# Patient Record
Sex: Female | Born: 1935 | Race: Black or African American | Hispanic: No | Marital: Single | Smoking: Never smoker
Health system: Southern US, Community
[De-identification: ages and names within clinical notes are randomized; demographics above are authoritative.]

## PROBLEM LIST (undated history)

## (undated) DIAGNOSIS — K529 Noninfective gastroenteritis and colitis, unspecified: Secondary | ICD-10-CM

## (undated) DIAGNOSIS — I10 Essential (primary) hypertension: Secondary | ICD-10-CM

## (undated) DIAGNOSIS — K219 Gastro-esophageal reflux disease without esophagitis: Secondary | ICD-10-CM

---

## 2000-09-29 ENCOUNTER — Emergency Department (HOSPITAL_COMMUNITY): Admission: EM | Admit: 2000-09-29 | Discharge: 2000-09-29 | Payer: Self-pay | Admitting: Emergency Medicine

## 2001-08-25 ENCOUNTER — Encounter: Payer: Self-pay | Admitting: Family Medicine

## 2001-08-25 ENCOUNTER — Ambulatory Visit (HOSPITAL_COMMUNITY): Admission: RE | Admit: 2001-08-25 | Discharge: 2001-08-25 | Payer: Self-pay | Admitting: Family Medicine

## 2001-08-30 ENCOUNTER — Ambulatory Visit (HOSPITAL_COMMUNITY): Admission: RE | Admit: 2001-08-30 | Discharge: 2001-08-30 | Payer: Self-pay | Admitting: General Surgery

## 2002-01-11 ENCOUNTER — Encounter: Payer: Self-pay | Admitting: Family Medicine

## 2002-01-11 ENCOUNTER — Encounter: Payer: Self-pay | Admitting: Internal Medicine

## 2002-01-12 ENCOUNTER — Inpatient Hospital Stay (HOSPITAL_COMMUNITY): Admission: EM | Admit: 2002-01-12 | Discharge: 2002-01-15 | Payer: Self-pay | Admitting: Internal Medicine

## 2002-01-12 ENCOUNTER — Encounter: Payer: Self-pay | Admitting: Family Medicine

## 2002-09-06 ENCOUNTER — Encounter: Payer: Self-pay | Admitting: *Deleted

## 2002-09-07 ENCOUNTER — Inpatient Hospital Stay (HOSPITAL_COMMUNITY): Admission: EM | Admit: 2002-09-07 | Discharge: 2002-09-11 | Payer: Self-pay | Admitting: *Deleted

## 2003-06-10 ENCOUNTER — Ambulatory Visit (HOSPITAL_COMMUNITY): Admission: RE | Admit: 2003-06-10 | Discharge: 2003-06-10 | Payer: Self-pay | Admitting: Family Medicine

## 2003-10-29 ENCOUNTER — Emergency Department (HOSPITAL_COMMUNITY): Admission: EM | Admit: 2003-10-29 | Discharge: 2003-10-29 | Payer: Self-pay | Admitting: Emergency Medicine

## 2004-03-24 ENCOUNTER — Emergency Department (HOSPITAL_COMMUNITY): Admission: EM | Admit: 2004-03-24 | Discharge: 2004-03-24 | Payer: Self-pay | Admitting: Emergency Medicine

## 2004-03-25 ENCOUNTER — Emergency Department (HOSPITAL_COMMUNITY): Admission: EM | Admit: 2004-03-25 | Discharge: 2004-03-26 | Payer: Self-pay | Admitting: Emergency Medicine

## 2004-08-11 ENCOUNTER — Ambulatory Visit (HOSPITAL_COMMUNITY): Admission: RE | Admit: 2004-08-11 | Discharge: 2004-08-11 | Payer: Self-pay | Admitting: Family Medicine

## 2005-02-23 ENCOUNTER — Emergency Department (HOSPITAL_COMMUNITY): Admission: EM | Admit: 2005-02-23 | Discharge: 2005-02-23 | Payer: Self-pay | Admitting: Emergency Medicine

## 2005-04-01 ENCOUNTER — Emergency Department (HOSPITAL_COMMUNITY): Admission: EM | Admit: 2005-04-01 | Discharge: 2005-04-01 | Payer: Self-pay | Admitting: Emergency Medicine

## 2005-07-05 ENCOUNTER — Inpatient Hospital Stay (HOSPITAL_COMMUNITY): Admission: AD | Admit: 2005-07-05 | Discharge: 2005-07-07 | Payer: Self-pay | Admitting: Family Medicine

## 2005-07-06 ENCOUNTER — Ambulatory Visit: Payer: Self-pay | Admitting: *Deleted

## 2005-10-15 ENCOUNTER — Emergency Department (HOSPITAL_COMMUNITY): Admission: EM | Admit: 2005-10-15 | Discharge: 2005-10-15 | Payer: Self-pay | Admitting: Emergency Medicine

## 2007-06-14 ENCOUNTER — Emergency Department (HOSPITAL_COMMUNITY): Admission: EM | Admit: 2007-06-14 | Discharge: 2007-06-14 | Payer: Self-pay | Admitting: Emergency Medicine

## 2007-09-15 ENCOUNTER — Emergency Department (HOSPITAL_COMMUNITY): Admission: EM | Admit: 2007-09-15 | Discharge: 2007-09-15 | Payer: Self-pay | Admitting: Emergency Medicine

## 2007-09-22 ENCOUNTER — Inpatient Hospital Stay (HOSPITAL_COMMUNITY): Admission: EM | Admit: 2007-09-22 | Discharge: 2007-09-26 | Payer: Self-pay | Admitting: Emergency Medicine

## 2007-11-08 ENCOUNTER — Ambulatory Visit: Payer: Self-pay | Admitting: Cardiology

## 2007-11-08 ENCOUNTER — Inpatient Hospital Stay (HOSPITAL_COMMUNITY): Admission: EM | Admit: 2007-11-08 | Discharge: 2007-11-13 | Payer: Self-pay | Admitting: Emergency Medicine

## 2007-11-10 ENCOUNTER — Encounter: Payer: Self-pay | Admitting: Cardiology

## 2009-06-20 IMAGING — CR DG CHEST 1V PORT
1 series · 1 of 1 positions shown · non-contrast
Comparison: 09/22/2007 and earlier.

CLINICAL DATA: 89-year-old female with hypertension.

PORTABLE CHEST - 1 VIEW

[view not recorded]
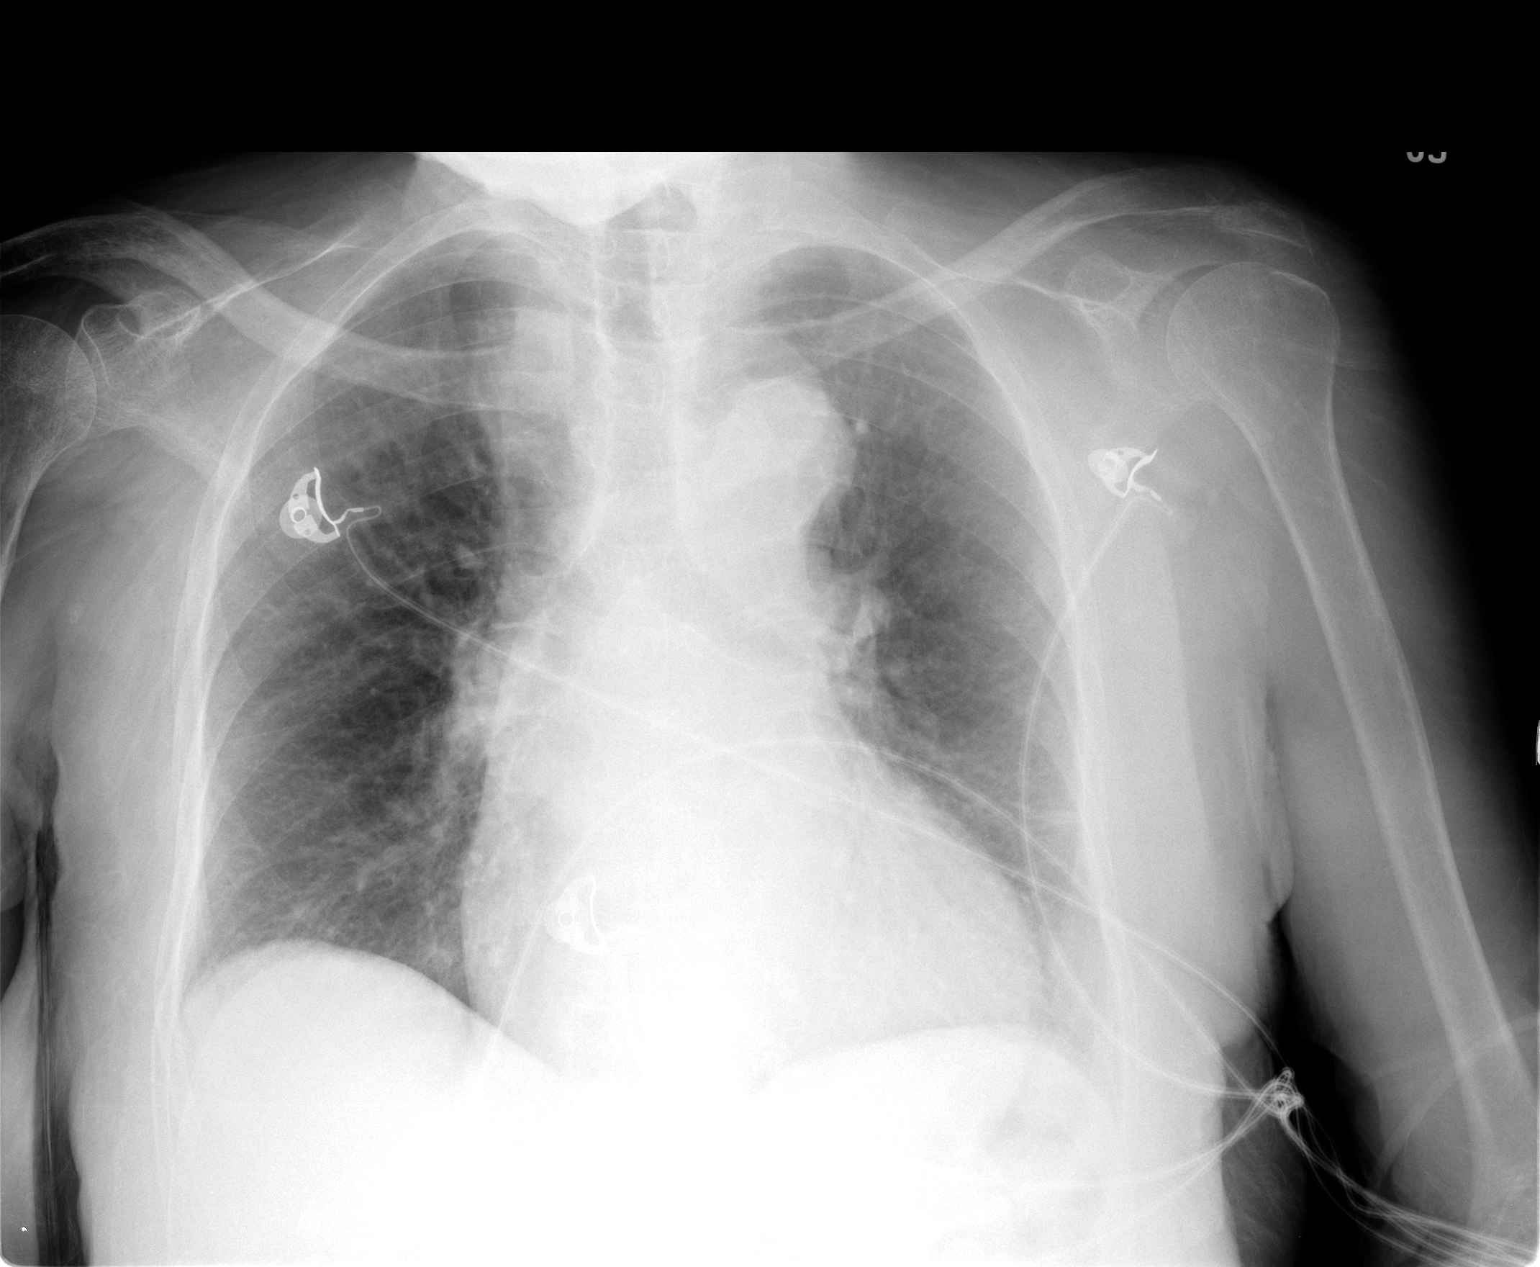

[1 of 1 positions shown; findings below may reference images not displayed]

FINDINGS: Portable upright AP view 1141 hours.  Stable cardiac size
and mediastinal contours.  Cardiomegaly and tortuous thoracic aorta
are re-identified.  Stable lung volumes.  No pneumothorax or
pulmonary edema.  Chronic increased interstitial markings versus
pulmonary vascular congestion.  No consolidation or acute airspace
opacity.
IMPRESSION: 1. No acute cardiopulmonary abnormality.
2.  Cardiomegaly with chronic increased interstitial markings
versus pulmonary vascular congestion.

## 2009-06-21 IMAGING — US US CAROTID DUPLEX BILAT
1 series · 13 of 24 positions shown · non-contrast
Comparison: 01/12/2002 by report only

CLINICAL DATA: Other-See Comment; ; SLURRED SPEECH, TIA, FELL

BILATERAL CAROTID DUPLEX ULTRASOUND
TECHNIQUE: Gray scale imaging, color Doppler and duplex ultrasound
was performed of bilateral carotid and vertebral arteries in the
neck.

[Series 1: us carotid duplex bilat · 0.08mm/px · 13 of 55 slices shown]
[im 1/55]
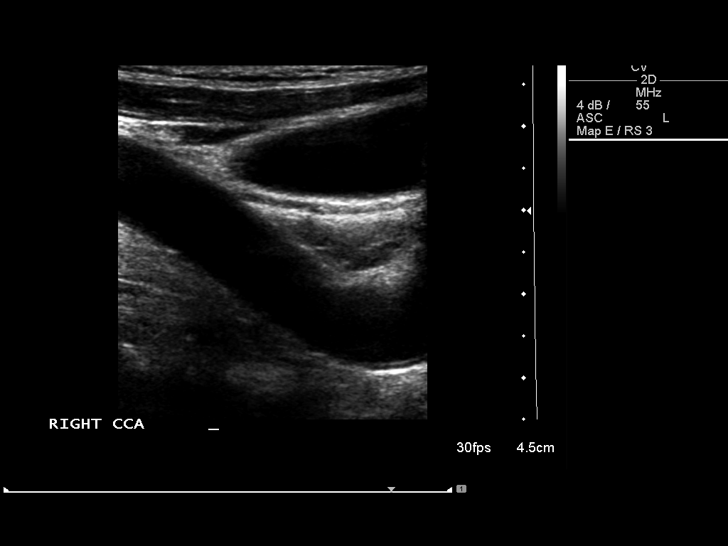
[im 5/55]
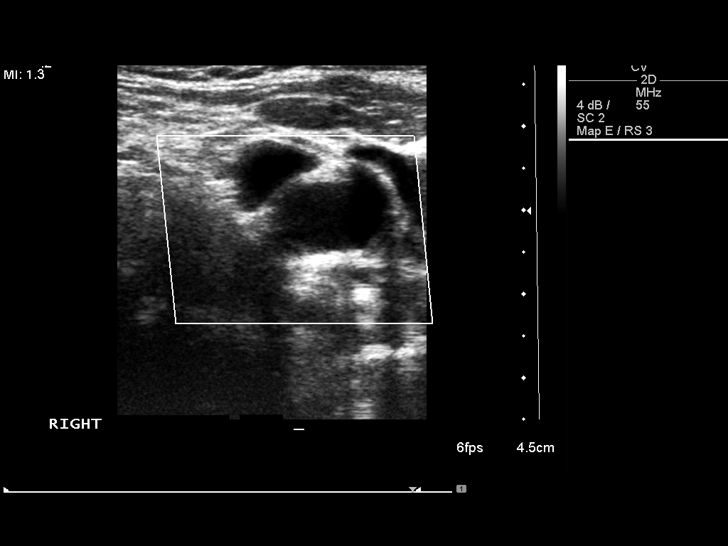
[im 10/55]
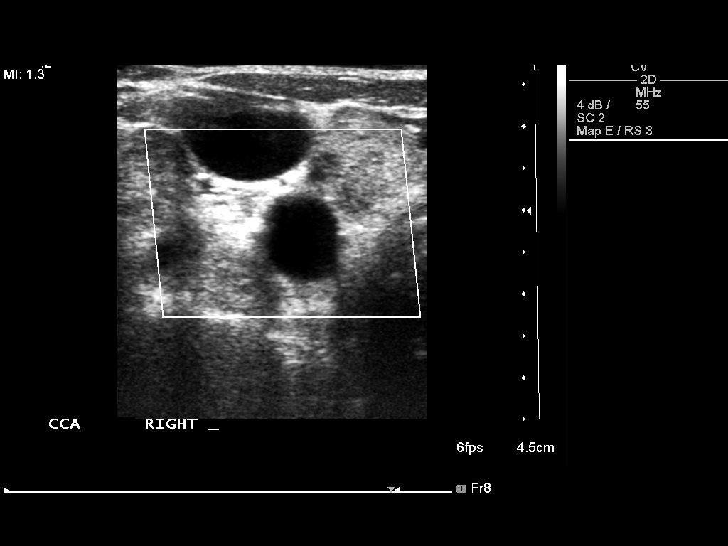
[im 15/55]
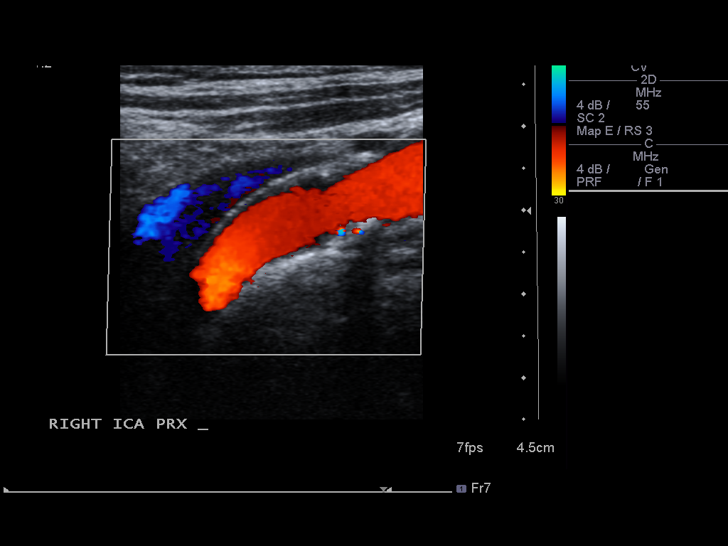
[im 19/55]
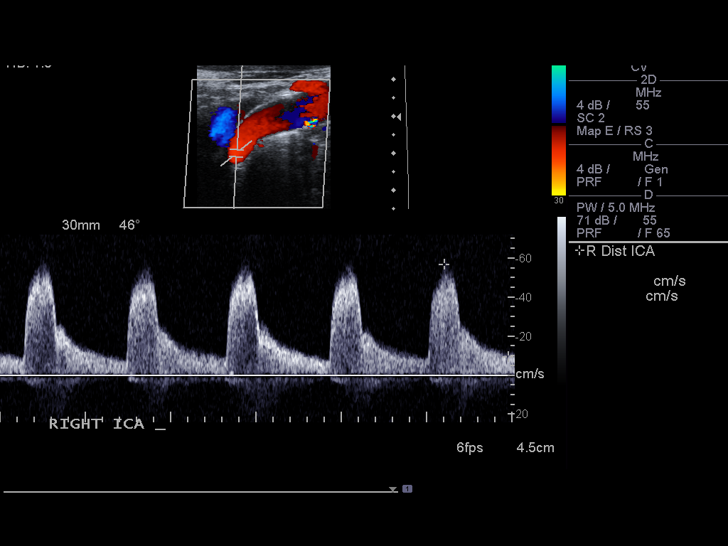
[im 24/55]
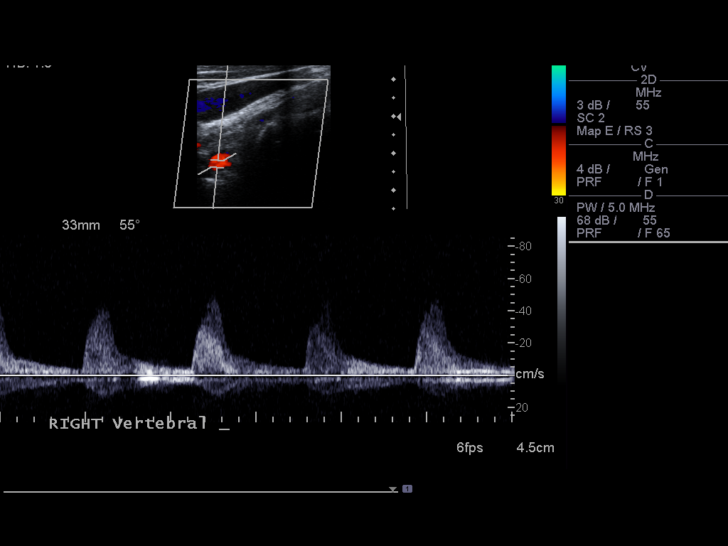
[im 29/55]
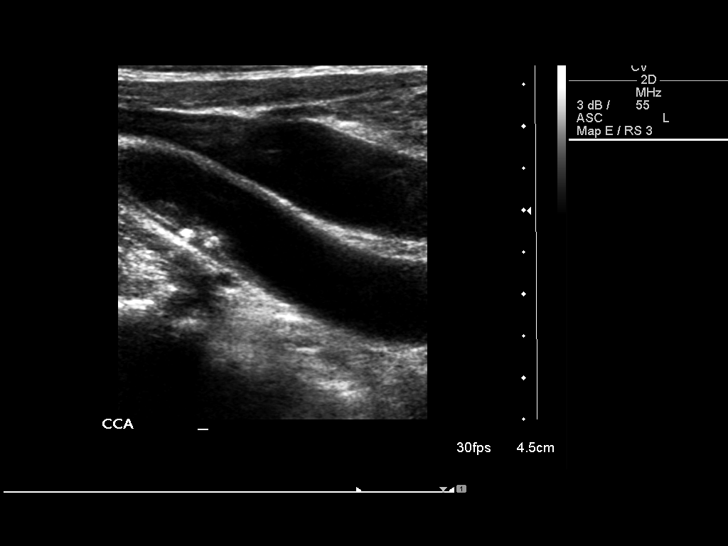
[im 31/55]
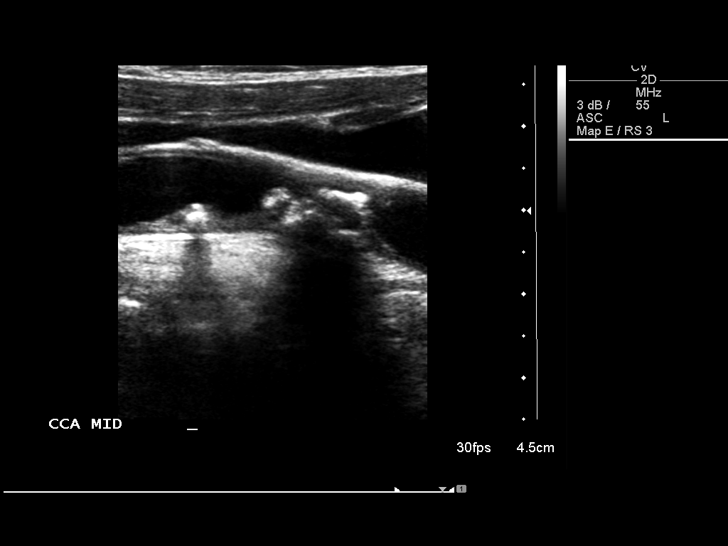
[im 36/55]
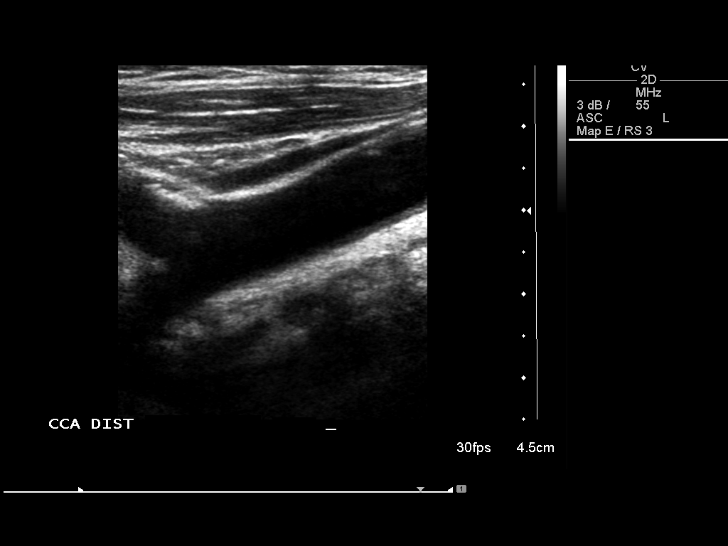
[im 40/55]
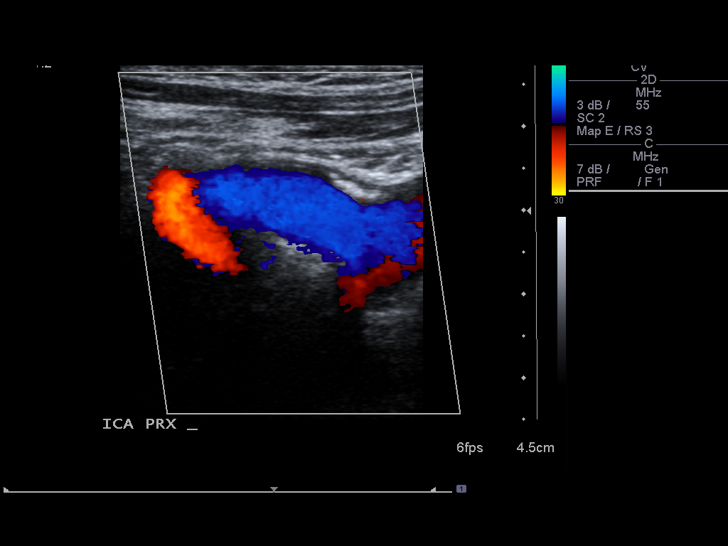
[im 45/55]
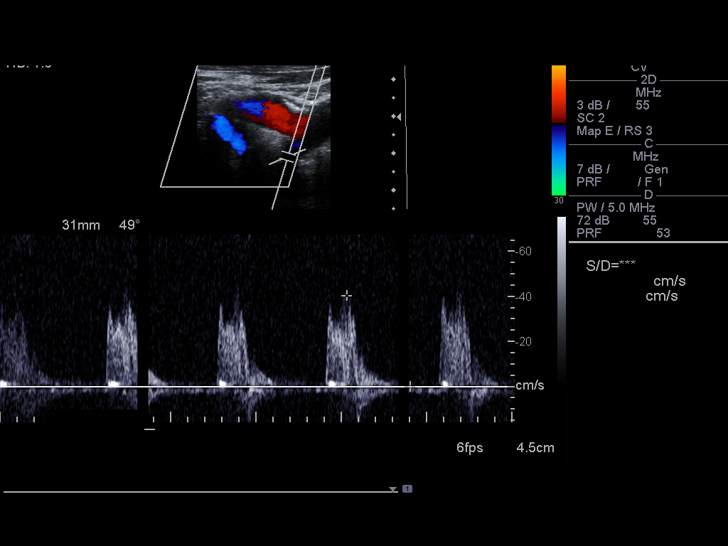
[im 50/55]
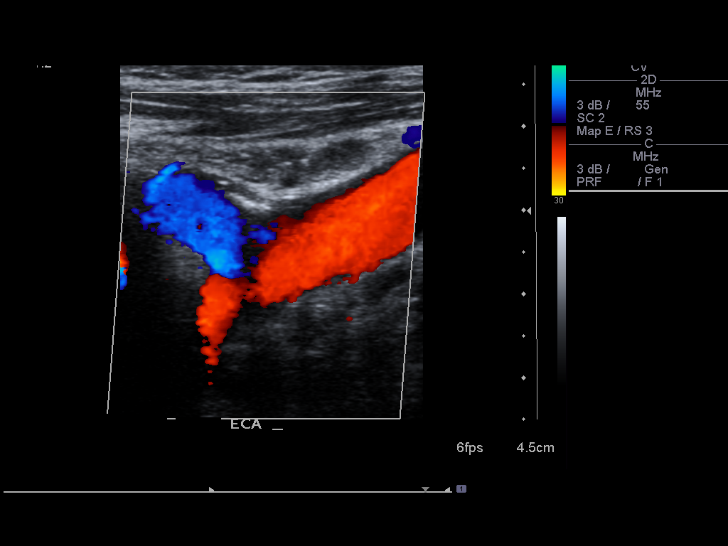
[im 55/55]
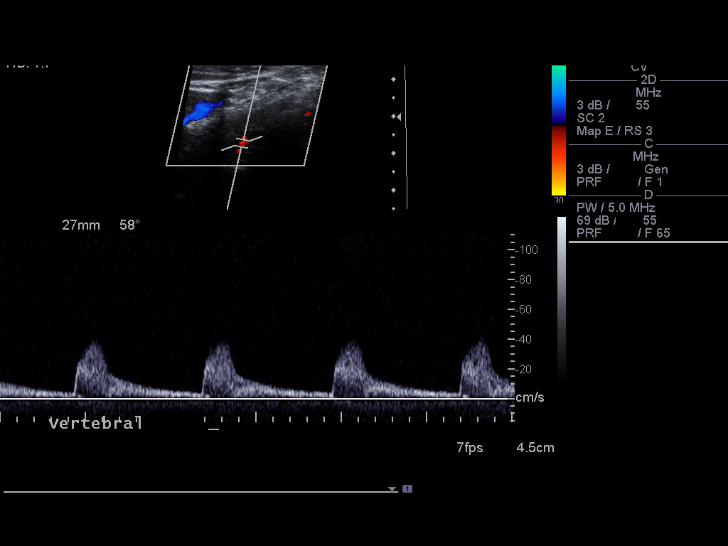

[13 of 24 positions shown; findings below may reference images not displayed]

Criteria:  Quantification of carotid stenosis is based on velocity
parameters that correlate the residual internal carotid diameter
with NASCET-based stenosis levels.

The following velocity measurements were obtained:

                 PEAK SYSTOLIC/END DIASTOLIC
RIGHT
ICA:                        57/10cm/sec
CCA:                        53/5cm/sec
SYSTOLIC ICA/CCA RATIO:
DIASTOLIC ICA/CCA RATIO:
ECA:                        32/4cm/sec

LEFT
ICA:                        95/10cm/sec
CCA:                        60/5cm/sec
SYSTOLIC ICA/CCA RATIO:
DIASTOLIC ICA/CCA RATIO:
ECA:                        41cm/sec
FINDINGS: RIGHT CAROTID ARTERY: There is mild partially calcified plaque in
the distal right common carotid artery and bulb extending to the
proximal ICA without significant stenosis.  Normal wave forms with
no focal aliasing on color Doppler interrogation.

RIGHT VERTEBRAL ARTERY:  Normal flow direction and wave form

LEFT CAROTID ARTERY: There is focal eccentric heavily calcified
plaque in the mid common carotid artery.  There is smooth partially
calcified plaque in the proximal ICA.  Normal wave forms with no
focal aliasing on color Doppler interrogation.  The left carotid
system is moderately tortuous.

LEFT VERTEBRAL ARTERY:  Normal flow direction and wave form.
IMPRESSION: 1.  Bilateral distal common carotid artery and proximal ICA plaque
without evidence of hemodynamically significant stenosis. The exam
does not exclude plaque ulceration or embolization.  Continued
surveillance recommended.

## 2009-08-10 ENCOUNTER — Emergency Department (HOSPITAL_COMMUNITY): Admission: EM | Admit: 2009-08-10 | Discharge: 2009-08-10 | Payer: Self-pay | Admitting: Emergency Medicine

## 2010-04-19 LAB — COMPREHENSIVE METABOLIC PANEL
ALT: 13 U/L (ref 0–35)
AST: 26 U/L (ref 0–37)
BUN: 12 mg/dL (ref 6–23)
CO2: 29 mEq/L (ref 19–32)
Calcium: 9 mg/dL (ref 8.4–10.5)
Glucose, Bld: 111 mg/dL — ABNORMAL HIGH (ref 70–99)
Potassium: 3.5 mEq/L (ref 3.5–5.1)
Total Bilirubin: 0.9 mg/dL (ref 0.3–1.2)
Total Protein: 7.1 g/dL (ref 6.0–8.3)

## 2010-04-19 LAB — DIFFERENTIAL
Basophils Relative: 1 % (ref 0–1)
Eosinophils Absolute: 0.1 10*3/uL (ref 0.0–0.7)
Eosinophils Relative: 2 % (ref 0–5)
Lymphocytes Relative: 28 % (ref 12–46)
Monocytes Absolute: 0.7 10*3/uL (ref 0.1–1.0)

## 2010-04-19 LAB — LIPASE, BLOOD: Lipase: 20 U/L (ref 11–59)

## 2010-04-19 LAB — URINALYSIS, ROUTINE W REFLEX MICROSCOPIC
Bilirubin Urine: NEGATIVE
Ketones, ur: NEGATIVE mg/dL
Specific Gravity, Urine: 1.015 (ref 1.005–1.030)
pH: 7.5 (ref 5.0–8.0)

## 2010-04-19 LAB — CBC
HCT: 39.1 % (ref 36.0–46.0)
MCHC: 33.1 g/dL (ref 30.0–36.0)
Platelets: 253 10*3/uL (ref 150–400)
RDW: 15.5 % (ref 11.5–15.5)

## 2010-06-16 NOTE — H&P (Signed)
Leah [name], Leah [name]              ACCOUNT NO.:  [id number]   MEDICAL RECORD NO.:  [id number]          PATIENT TYPE:  INP   LOCATION:  A309                          FACILITY:  [facility]   PHYSICIAN:  Tesfaye D. Felecia Shelling, MD   DATE OF BIRTH:  1918/05/18   DATE OF ADMISSION:  11/08/2007  DATE OF DISCHARGE:  LH                              HISTORY & PHYSICAL   CHIEF COMPLAINT:  High blood pressure.   HISTORY OF PRESENT ILLNESS:  This is an 77 year old female patient with  history of multiple medical illnesses including hypertension and CVA who  was brought to emergency room due to severely elevated blood pressure.  During initial evaluation in the emergency room, the patient had  systolic blood pressure of 235.  This was gradually improved after the  patient was given medications in emergency room.  She had also a  significant bradycardia of 40-50 beats permanence.  The patient had no  chest pain, shortness of breath.  The patient was then admitted under  telemetry for further evaluation.   REVIEW OF SYSTEMS:  The patient has no fever, chills, cough, headache,  chest pain, nausea, vomiting or abdominal pain.  No dysuria, urgency or  frequency of urination.   PAST MEDICAL HISTORY:  1. Hypertension.  2. Status post CVA.  3. History of dehydration.  4. History of gastroenteritis.  5. History of hyperlipidemia.   CURRENT MEDICATIONS:  1. Simvastatin 20 mg p.o. daily.  2. Omeprazole 20 mg daily.  3. Lisinopril 20 mg daily.  4. Hydralazine 25 mg t.i.d.  5. Cozaar 100 mg daily.  6. Catapres TTS weekly.   SOCIAL HISTORY:  The patient lives alone.  She has a home aid for about  3 hours.  No history of alcohol, tobacco or substance abuse.   PHYSICAL EXAMINATION:  The patient is alert awake, chronically sick  looking with vitals on admission - blood pressure 235/73, pulse 51,  respiratory rate 20, temperature 97.9 degrees Fahrenheit.  HEENT:  Pupils are equal and reactive.  NECK:  Is  supple.  CHEST:  Clear lung fields.  Good air entry.  CARDIOVASCULAR SYSTEM:  First and second heart sound heard.  No murmur,  no gallop.  ABDOMEN:  Is soft and lax.  Bowel sounds positive.  No mass or  organomegaly.  EXTREMITIES:  No leg edema.   ADMISSION LABORATORY DATA:  CBC:  WBC 6.4, hemoglobin 13.3, hematocrit  39.5, platelet 212.  CPK 75, CK-MB 2.1, troponin 0.04.   ASSESSMENT:  1. Uncontrolled severe hypertension. The patient lives alone, and she      has difficulty in performing her daily living activity.  I am not      sure where the patient was taking her medication regularly.  2. Bradyarrhythmia.  3. Status post cerebrovascular accident.  4. Hyperlipidemia.  5. History of dehydration.   PLAN:  We will continue serial EKG and cardiac enzymes.  We will admit  the patient under telemetry.  Will adjust her antihypertensive  medications.  Will do cardiology consult.  I have discussed with the  patient and her niece about  her general condition and the fact that she  may need to be placed in a rest home to receive assistance in her daily  living activity.  The niece and the patient has agreed to placement.  We  will go ahead and get social worker to start working on placement.      Tesfaye D. Felecia Shelling, MD  Electronically Signed     TDF/MEDQ  D:  11/09/2007  T:  11/09/2007  Job:  [id number]

## 2010-06-16 NOTE — H&P (Signed)
NAMEJAILYN, Leah [name]:  [id number]   MEDICAL RECORD NO.:  [id number]          PATIENT TYPE:  INP   LOCATION:  A327                          FACILITY:  [facility]   PHYSICIAN:  Melvyn Novas, MDDATE OF BIRTH:  1918-04-03   DATE OF ADMISSION:  09/22/2007  DATE OF DISCHARGE:  LH                              HISTORY & PHYSICAL   The patient is an 77 year old black female, the patient of Dr. Felecia Shelling,  who complains of 1-week history of recurrent nausea.  She had some mild  biliary green emesis yesterday.  There was no hematemesis, melena, or  hematochezia.  She denies diarrhea.  The patient was admitted and seen  in the ER several days ago and comes back again.  Her labs are  essentially within normal parameters, however, she has a right-sided  chronic hemiparesis from an old CVA and lives alone, inability to care  for self as well as consideration given to normal pressure hydrocephalus  and debilitating conditions, which are dwindling.  She was admitted for  attention to all the above details.   PAST MEDICAL HISTORY:  Significant for:  1. CVA.  2. Hypertension.  3. Hyperlipidemia.   PAST SURGICAL HISTORY:  Remarkable for:  1. Appendectomy.  2. Hysterectomy.   SOCIAL HISTORY:  She does not smoke, lives alone, and walks with a  walker.   CURRENT MEDICATIONS:  1. Lisinopril/hydrochlorothiazide 20/25 p.o. daily.  2. Catapres-TTS patch 3 every week.  3. Zocor 40 mg per day.  4. Cozaar 100 mg daily.  5. Lasix 40 mg per day.  6. Prilosec 20 mg per day.  7. Hydralazine 25 mg p.o. t.i.d.   PHYSICAL EXAMINATION:  VITAL SIGNS:  Blood pressure is 157/78,  temperature is 97.8, respiratory rate is 16, and pulse is 60.  EYES:  PERRLA.  Extraocular movements intact.  Sclerae clear.  Conjunctivae pink.  NECK:  Showed no JVD, no carotid bruits, no thyromegaly, no thyroid  bruits.  HEART:  Regular rhythm. No murmurs, gallops, heaves, thrills, or rubs.  ABDOMEN:   Soft and nontender.  Bowel sounds are normoactive.  No  guarding, rebound, mass, or organomegaly.  LUNGS:  Clear to A and P.  No rales, wheezes, or rhonchi appreciable.  EXTREMITIES:  No clubbing, cyanosis, or edema.  NEUROLOGIC:  Cranial nerves II through XII are grossly intact.  There is  mild right-sided hemiparesis noted.  The patient is alert.   IMPRESSION:  1. Volume depletion.  2. Recurrent nausea.  3. Gastroenteritis.  4. History of cerebrovascular accident.  5. Hypertension.  6. Hyperlipidemia.   PLAN:  To admit and as per orders.      Melvyn Novas, MD  Electronically Signed     RMD/MEDQ  D:  09/22/2007  T:  09/23/2007  Job:  [id number]

## 2010-06-16 NOTE — Consult Note (Signed)
[name], [name] NO.:  [id number]   MEDICAL RECORD NO.:  [id number]          PATIENT TYPE:  INP   LOCATION:  A309                          FACILITY:  [facility]   PHYSICIAN:  Gerrit Friends. Dietrich Pates, MD, FACCDATE OF BIRTH:  1918/08/13   DATE OF CONSULTATION:  11/09/2007  DATE OF DISCHARGE:                                 CONSULTATION   CARDIOLOGIST:  She was previously seen by Dr. Vida Roller in the  past.  She will be new to Dr. [place] Bing.   PRIMARY CARE PHYSICIAN:  Dr. Felecia Shelling.   REASON FOR CONSULTATION:  Bradycardia.   HISTORY OF PRESENT ILLNESS:  Ms. Leah [name] is an 77 year old female patient  with a history of labile hypertension and asymptomatic bradycardia, who  was brought to the emergency room secondary to slurred speech and  elevated blood pressure.  In the emergency room at [facility],  her blood pressure was 235/73.  She was noted be bradycardic with heart  rates in the 40s-50s.  We are now asked to further evaluate.  She was  seen by Dr. Dorethea Clan in 2007, for similar findings.  At that time, she was  asymptomatic with her bradycardia and noted to be chronotropically  competent.  She denies any history of chest pain or shortness of breath.  She describes NYHA class II to class IIB symptoms.  She is somewhat  debilitated from a prior history of stroke with residual right-sided  weakness.  She denies orthopnea or PND.  She has occasional pedal edema  without significant change.  She denies syncope or near syncope.  She  has had weakness resulting in falls that she thinks is secondary to her  right-sided weakness from her stroke.  She continues to note some mild  slurred speech this morning.  Otherwise, she feels well.   PAST MEDICAL HISTORY:  1. Hypertension with history of multiple admissions for hypertensive      urgency.  She had moderate LVH by echocardiogram in June 2007.  2. Mild aortic stenosis with a mean gradient of 17.6 mmHg in June      2007.  3. Good LV function.      a.     EF 60-65% by echocardiogram, June 2007.  4. History of asymptomatic bradycardia.  5. History of CVA with residual right-sided weakness.  6. Hyperlipidemia.  7. Degenerative joint disease.  8. Status post total abdominal hysterectomy.  9. Status post appendectomy.  10.Status post cataract surgery.  11.GERD.   ALLERGIES:  NO KNOWN DRUG ALLERGIES.   MEDICATIONS PRIOR TO ADMISSION:  1. Catapres - unknown dosage weekly.  2. Omeprazole 20 mEq daily.  3. Simvastatin 40 mg nightly.  4. Hydralazine 25 mg three times a day  5. Lisinopril 20 mg b.i.d.  6. Cozaar 100 mg daily.  7. Oxybutynin 5 mg b.i.d.  8. Xanax 0.25 mg daily p.r.n.   SOCIAL HISTORY:  The patient lives in [place] by herself.  She denies  tobacco or alcohol abuse.   FAMILY HISTORY:  Insignificant for CAD.   REVIEW OF SYSTEMS:  Please see HPI.  She  denies fevers, chills, dysuria,  hematuria, melena, hematochezia, nausea, vomiting, diarrhea, cough.  The  rest of the review of systems are negative.   PHYSICAL EXAMINATION:  GENERAL:  She is a well-nourished, well-developed  elderly female in no acute distress.  VITAL SIGNS:  Blood pressure 202/80, pulse 47, respirations 18,  temperature 97.8.  HEENT:  Normal.  NECK:  Without JVD.  LYMPHS:  Without lymphadenopathy.  ENDOCRINE:  Without thyromegaly.  CARDIAC:  Normal S1-S2.  Regular rate and rhythm with a 1/6 systolic  ejection murmur, best heard at the sternal border.  LUNGS:  Clear to auscultation bilaterally.  SKIN:  Without rash.  ABDOMEN:  Soft, nontender with normoactive bowel sounds.  No  organomegaly.  EXTREMITIES:  Without clubbing, cyanosis or edema.  MUSCULOSKELETAL:  Without joint deformity.  NEUROLOGIC:  She is alert and oriented x3.  Cranial nerves II-XII are  grossly intact.  VASCULAR:  Without carotid bruits bilaterally.   Head CT, no acute changes, chronic ventriculomegaly - normal pressure   hydrocephalus cannot be ruled out.  Chest x-ray cardiomegaly with  increased interstitial markings versus pulmonary vascular congestion.  EKG - sinus rhythm with heart rate of 52, T-wave inversions in V1-V6,  normal axis.   LABORATORY DATA:  White count 6000, hemoglobin 12.3, hematocrit 36.6,  platelet count 205,000, potassium 3.9, creatinine 1.1.  Cardiac markers  negative x3.   ASSESSMENT/PLAN:  1. Hypertensive urgency.  The patient has also been interviewed and      examined by Dr. Dietrich Pates.  Her blood pressure is still somewhat      uncontrolled.  Her clonidine has been changed to Catapres patch TTS-      2.  Hydrochlorothiazide and amlodipine have also been added to her      medical regimen.  AV nodal blocking agents should be avoided      secondary to her bradycardia.  2. Asymptomatic bradycardia.  Her rates dip into the 30s-40s at times.      As noted, AV nodal blocking agent should be avoided.  She will      continue on telemetry.  She should be ambulated with the nursing      staff.  If she becomes symptomatic or has significant pauses on      telemetry, she may require pacemaker implantation.  Otherwise, no      further workup is warranted this time.  3. Slurred speech.  This is likely related to her hypertensive      urgency.  Neurologic evaluation can certainly be considered by the      primary service if felt to be warranted.  Carotid Dopplers will be      checked to rule out significant ICA stenosis.  4. Mild aortic stenosis by echocardiogram in 2007.  This has not been      reevaluated since that time.  We will check an echocardiogram to      reevaluate her aortic stenosis, as well as her LV function.   DISPOSITION:  Thank you very much for the consultation.  We will be glad  to follow the patient throughout the remaining of this admission.      Tereso Newcomer, PA-C      Gerrit Friends. Dietrich Pates, MD, [facility]  Electronically Signed    SW/MEDQ  D:  11/10/2007  T:   11/10/2007  Job:  [id number]   cc:   Tesfaye D. Felecia Shelling, MD  Fax: [phone]

## 2010-06-16 NOTE — Group Therapy Note (Signed)
Leah [name], Leah [name]:  [id number]   MEDICAL RECORD NO.:  [id number]          PATIENT TYPE:  INP   LOCATION:  A327                          FACILITY:  [facility]   PHYSICIAN:  Edward L. Juanetta Gosling, M.D.DATE OF BIRTH:  06/06/1918   DATE OF PROCEDURE:  DATE OF DISCHARGE:                                 PROGRESS NOTE   The patient of Dr. Letitia [name].  Leah [name] was admitted with a diagnosis of  nausea.  She had biliary green emesis.  No diarrhea.  She says she is  feeling better.  She does have chronic hemiparesis from a right-sided  CVA.  She lives by herself, so she has had more difficulty in managing  on her own.  This morning, she says she feels very well.  She is not  having any more nausea.   Her physical examination this morning shows a temperature of 98.3, pulse  60, respirations 20, blood pressure is up at 191/79.  Her chest is  clear.  Neurologically, she does show the hemiparesis.  Her heart is  regular.   ASSESSMENT:  She seems better from a nausea point of view.  She still  has problems with her blood pressure, which is not controlled as yet.   My plan then is to go ahead and increase her Prinivil to 40 mg daily.  We are going to ask for physical therapy consultation to see if she get  up and move around.  No other new treatments.      Edward L. Juanetta Gosling, M.D.  Electronically Signed     ELH/MEDQ  D:  09/24/2007  T:  09/25/2007  Job:  [id number]

## 2010-06-19 NOTE — Discharge Summary (Signed)
Leah [name], Leah [name]                          ACCOUNT NO.:  [id number]   MEDICAL RECORD NO.:  [id number]                   PATIENT TYPE:  INP   LOCATION:  A314                                 FACILITY:  [facility]   PHYSICIAN:  Dirk Dress. Katrinka Blazing, M.D.                DATE OF BIRTH:  [birth year]   DATE OF ADMISSION:  09/06/2002  DATE OF DISCHARGE:  09/11/2002                                 DISCHARGE SUMMARY   DISCHARGE DIAGNOSES:  1. Accelerated hypertension with hypertensive urgency.  2. Osteoarthritis.  3. History of old cerebrovascular accident.  4. Hyperlipidemia.   DISPOSITION:  The patient is discharged home in stable and improved  condition.  Blood pressure, prior to discharge, was 140/60.   DISCHARGE MEDICATIONS:  1. K-Dur 20 mEq every day.  2. Aspirin 325 mg every day.  3. Lescol 40 mg every day.  4. Vaseretic 10/25 b.i.d.  5. Avapro 300 mg every day.  6. Catapres-TTS-3 every seven days.   The patient is scheduled to be seen by Dr. [name] in one week.   SUMMARY:  An 77 year old female admitted for accelerated  hypertension with  hypertensive urgency.  She came to the emergency room complaining of  dizziness for two days.  She admits to having stopped taking her Vaseretic  because it caused her to urinate too much.  She also was noncompliant in  eating a diet heavy in salt, while she was also very diuretic.  She came to  the emergency room because of multiple falls and episodes of dizziness.  Blood pressure was 256/106.  She was treated with IV labetalol in the  emergency room and her blood pressure gradually dropped to 197/93.  She was  admitted for further treatment and control of her blood pressure.  The  patient has a long history of hypertension.  She had a previous stroke with  a right hemiparesis.  She also had hyperlipidemia and osteoarthritis.  The  patient was treated primarily for hypertension.  She did not have any new  neurologic deficit and there was no evidence  of peripheral edema, nor was  there any evidence of heart failure.  With reinstitution of her medications,  the patient's blood pressure gradually improved.  Her heart rate was  monitored to make sure that she did not develop bradycardia.  Her Vaseretic  was then increased to 10/25 b.i.d. and Avapro was added.  She had no further  problems, and after her blood pressure was adequately controlled, she was  given strict instructions about diet and about compliance with her  medications.  She was discharged home in stable and satisfactory condition.    ___________________________________________                                         Dirk Dress Katrinka Blazing,  M.D.   LCS/MEDQ  D:  10/28/2002  T:  10/28/2002  Job:  [id number]

## 2010-06-19 NOTE — H&P (Signed)
NAMESERRINA, MINOGUE NO.:  [id number]   MEDICAL RECORD NO.:  [id number]                   PATIENT TYPE:  INP   LOCATION:  IC06                                 FACILITY:  [facility]   PHYSICIAN:  Dirk Dress. Katrinka Blazing, M.D.                DATE OF BIRTH:  08/05/1918   DATE OF ADMISSION:  09/06/2002  DATE OF DISCHARGE:                                HISTORY & PHYSICAL   HISTORY OF PRESENT ILLNESS:  This is an 77 year old female admitted for  accelerated hypertension with hypertensive urgency.  The patient came to the  emergency room complaining of increasing dizziness.  She states that she had  intermittent episodes of dizziness for two days prior to admission.  She  admits to having stopped taking her Vaseretic because she states that it  caused her to urinate too much.  She also probably did not take some of her  antihypertensive medications.  She also ate a healthy amount of salted pork  and bacon during the week while she was off her diuretic.  The patient  states that she became so dizzy that she would fall back in bed when she  tried to get up.  After multiple episodes she was going to the emergency  room where on evaluation her blood pressure initially was 256/106.  She was  treated with IV labetalol, and her blood pressure reduced to 230/120 and  then 230/98.  She was still 197/93 at the time of admission.  The patient  will be treated until her blood pressure is controlled.   PAST HISTORY:  1. She has a long history of hypertension.  2. She has a history of ischemic stroke on the left side in 1995, with a     resultant right hemiparesis.  3. Hyperlipidemia.  4. Osteoarthritis.   PRESENT MEDICATIONS:  1. Catapres-TTS-3 every seven days.  2. DynaCirc 5 mg b.i.d.  3. Labetalol 100 mg, 2 tablets daily.  4. Vaseretic 10-25 daily.  5. Lescol 40 mg daily.  6. Aspirin 325 mg daily.   ALLERGIES:  She has no known drug allergies.   SURGERY:  1.  Appendectomy.  2. Hysterectomy.   SOCIAL HISTORY:  She is a widow.  Disabled.  She ambulates with a cane.  She  lives alone in a senior citizen's-type apartment.  She does not have a  history of alcohol, drug, or tobacco use.   FAMILY HISTORY:  Positive for stroke, dementia, hypertension, and  atherosclerotic heart disease.   REVIEW OF SYSTEMS:  The patient complains of mild weakness on her right  side, occasional headache, and some joint pain.  She has no other  complaints.   PHYSICAL EXAMINATION:  GENERAL:  At the time of this exam she is bright and  pleasant.  She is sitting in a recliner.  VITAL SIGNS:  Blood pressure is 175/80, pulse 60, respirations 20,  temperature  97.2.  HEENT:  Unremarkable except for slight puffiness in the extraorbital area on  the right.  There is no major right facial droop.  NECK:  Supple.  No bruit, adenopathy, thyromegaly, or jugular venous  distention.  CHEST:  Clear to auscultation.  No rales, rubs, rhonchi, or wheezes.  BREASTS:  Soft.  No skin changes.  No palpable masses.  Axilla normal.  HEART:  Irregular rhythm.  No murmur or gallop.  ABDOMEN:  Nondistended.  She has two healed hypogastric scars.  There is no  tenderness.  No palpable masses.  RECTAL:  Not done.  EXTREMITIES:  No peripheral edema.  Nonpalpable distal pulses in her feet.  She has warm feet with good capillary refill.  NEUROLOGIC:  She is alert and oriented.  Speech is clear.  Cranial nerves II-  XII are intact.  She has 4/5 strength on the right and 5/5 on the left.  Sensory appears to be intact.   IMPRESSION:  1. Accelerated hypertension with hypertensive urgency.  2. History of cerebrovascular accident.  3. Hyperlipidemia.  4. Osteoarthritis.   PLAN:  The patient will be started on her medications.  Cardura will be  withheld.  She will be started on Avapro 150 mg once daily in the place of  Cardura.  Vaseretic 10-25 will be continued.                                                Dirk Dress. Katrinka Blazing, M.D.    LCS/MEDQ  D:  09/07/2002  T:  09/07/2002  Job:  [id number]

## 2010-06-19 NOTE — Discharge Summary (Signed)
NAMESHALAWN, WYNDER NO.:  [id number]   MEDICAL RECORD NO.:  [id number]                   PATIENT TYPE:  INP   LOCATION:  A316                                 FACILITY:  [facility]   PHYSICIAN:  Annia Friendly. [name], M.D.                DATE OF BIRTH:  Aug 25, 1918   DATE OF ADMISSION:  01/11/2002  DATE OF DISCHARGE:  01/15/2002                                 DISCHARGE SUMMARY   HISTORY OF PRESENT ILLNESS:  The patient was an 77 year old widow gravida 1  para 0 AB 1 disabled black female from [place], [place].  She  complained of general weakness on the day of admission.  Weakness was felt  especially involving right arm and leg.  The patient also complained of dry  cough, irritation of throat, general malaise and nasal congestion x24 hours.  She denied dysarthria, dysphagia, nausea, vomiting, diarrhea, diplopia,  headache, syncope and dizziness.   MEDICAL HISTORY:  Chronic right hemiparesis secondary to old left CVA in  1975, hypertension, hyperlipidemia and osteoarthritis.   ALLERGIES:  The patient was not allergic to any known medication.   HABITS:  Negative for tobacco, ethanol or street drugs.   SEXUAL TRANSMITTED DISEASE HISTORY:  Negative for gonorrhea, syphilis,  herpes and HIV infection.   PREVIOUS HOSPITALIZATIONS:  Positive hospitalization in [place] at [facility] [place];  appendectomy at [facility]; hysterectomy in [place] secondary  to heavy menstrual bleeding.   FAMILY HISTORY:  Mother deceased at age 42 secondary to complication of  stroke; father deceased at age 54 secondary to cancer (type unknown); three  sisters living at age 54 health unknown, one at 49 with history of  hypertension and dementia and age 80 with history of hypertension and severe  dementia; three sisters deceased (one as an infant secondary to pneumonia  and two sisters deceased cause  unknown); two brothers deceased (one at 37  secondary to cancer and her other brother secondary to heart attack and  leukemia).   PHYSICAL EXAMINATION:  VITALS ON THE FLOOR REVEALED THE FOLLOWING:  Temperature 100.1, blood pressure 195/86, pulse 72 and respirations 20.  GENERAL APPEARANCE:  Elderly slightly short medium-frame alert black female  who appeared not to feel well but in no apparent respiratory distress.  EYES:  No discharge.  NOSE:  Negative for discharge.  MOUTH:  No droop or clonus.  Tongue was midline.  Posterior pharynx was  benign.  LUNGS:  Clear.  HEART:  Audible S1 and S2 without murmur, rub, or gallop.  Rhythm was  irregular and rate within normal limit.  EXTREMITIES:  No edema.  NEUROLOGICAL:  The patient alert and oriented to person, place, and time.  Speech was clear and appropriate.  Cranial nerves II-XII appeared intact.  SKIN:  Hot and dry.   SIGNIFICANT LABORATORIES ON ADMISSION:  Cliffton Asters  count 7.3, hemoglobin 13.4,  hematocrit 40.4, platelets 258,000.  Glucose 110, BUN 15, creatinine 0.9,  sodium 135, potassium 3.6, chloride 98, CO2 28, calcium 9.2, total protein  6.3, albumin 3.6, SGOT 20, SGPT 14, alkaline phosphatase 56, total bilirubin  1.1, total CPK 114, CK-MB 1.0, and troponin I of 1.0.  Her chest x-ray on  admission demonstrated cardiomegaly.  Mild ectasia and tortuosity of  thoracic aorta.  The lungs were clear as read by Dr. Tinnie Gens T. Si Gaul on  January 11, 2002.   HOSPITAL COURSE:  Problem 1. GENERALIZED WEAKNESS.  The patient was put on  telemetry x24 hours, IV normal saline, Tamiflu 75 mg p.o. b.i.d. x5 days,  antitussive medication and Zithromax for 5 days.  The patient was also given  Tylenol for temperature.  The patient's hospital course was uphill.  She  responded to her therapy.  She felt significantly better at time of  discharge.  She had no complaint of frequent cough, general malaise,  shortness of breath, nausea or weakness.  She is  alert and oriented to  person, place and time at time of discharge.  Moreover, she was afebrile.  She was discharged to her home where she lives in an apartment complex.   Problem 2. SEVERE HYPERTENSION.  Blood pressure on admission was 195/86.  Her heart exam was within normal limits.  Chest x-ray demonstrated  cardiomegaly.  The patient was treated with a low-sodium diet, DynaCirc 5 mg  p.o. b.i.d., HCT/enalapril 10/25 one tablet p.o. every day, Catapres-TTS  patch every 7 days, labetalol 100 mg p.o. b.i.d.  Blood pressure was brought  under control with this treatment plan.  Blood pressure on the morning of  discharge was 130/63.  Heart rate was 49.  The patient was asymptomatic.  Her heart rate will be followed closely as an outpatient.  Her heart exam on  the morning of discharge revealed audible S1 and S2 without murmur, rub, or  gallop.  Rhythm was regular.  Rate was 52.  The patient had no complaint of  fatigue, chest pain or shortness of breath.  Examination of extremities  demonstrated no edema.   Problem 3. CHRONIC RIGHT HEMIPARESIS SECONDARY TO OLD LEFT CEREBROVASCULAR  ACCIDENT.  A CT of the head was ordered on admission because of her  complaint of weakness on January 11, 2002.  CT of the head revealed the  following as ordered by the ER physician: Diffuse ventricular dilatation  noted.  This could be due to normal pressure hydrocephalus/communicating  hydrocephalus or atrophy; chronic small vessel ischemia disease changes as  read by Dr. Jonelle Sports. Shepherd.  The patient was continued on one aspirin 81  mg p.o. every day, neuro checks every 2 hours x24 hours, out of bed only  with assistance and telemetry x24 hours.  The patient on admission has some  right upper and right lower extremity weakness which was not new.  Speech  was clear and appropriate.  Memory was intact.  Mouth demonstrated no droop of corner of mouth.  Tongue was midline.  Strength could be described as   follows: Left upper +4, right upper +2, left lower +4 and right lower +3.  Tactile and proprioception were intact.  The patient could not extend the  right upper extremity to maximum extension.  The patient remained  essentially unchanged pertaining to weakness of right lower extremity.  She  had no complaint of dysphagia during this hospitalization.  She was  discharged to her  home.   Problem 4. HYPERLIPIDEMIA.  The patient in addition to sodium restriction  was continued on Lescol 40 mg p.o. every bedtime.  Lipid profile on January 11, 2002 at 1900 hours revealed the following: Total cholesterol 159 mg/dL,  triglycerides 67 mg/dL, HDL cholesterol 61 mg/dL, LDL 95 mg/dL.  The patient  will be followed periodically with liver function test and lipid profile.   Problem 5. OSTEOARTHRITIS.  Examination of joints demonstrated no redness,  hotness or significant pain during admission or during this hospitalization.  She will be advised to take Tylenol 500 mg p.o. b.i.d. for joint discomfort  initially.   DISPOSITION:  The patient was admitted on January 11, 2002 and discharged  on January 15, 2002.   INSTRUCTIONS AT TIME OF DISCHARGE:  1. Diet: Low salt and low cholesterol.  2. Activity: Ambulate with cane.  3. Medications:     a. Catapres-TTS-3 one patch every Thursday to be changed.     b. DynaCirc 5 mg one tablet p.o. b.i.d.     c. Labetalol 100 mg two tablets p.o. b.i.d.     d. Vaseretic 10/25 one tablet p.o. every day.     e. Lescol 40 mg one tablet p.o. every bedtime.     f. One aspirin 325 mg p.o. every day.     g. Tessalon Perles one perle every 8 hours.     h. Flumadine 100 mg p.o. q.12h. x5 days.        a. Tylenol 325 mg one tablet p.o. q.6-8h. as needed for headache or           pain.   FOLLOW UP:  Dr. [name] on January 19, 2002.   HOME HEALTH:  [facility] will continue to provide aid in home as  outpatient.   FINAL PRIMARY DIAGNOSIS:  Acute viral  syndrome.   SECONDARY DIAGNOSES:  1. Hypertension.  2. Hyperlipidemia.  3. Osteoarthritis.  4. Chronic right hemiparesis secondary to old left cerebrovascular accident     in 17.                                               Annia Friendly. [name], M.D.    Levonne Hubert  D:  01/15/2002  T:  01/16/2002  Job:  [id number]

## 2010-06-19 NOTE — Consult Note (Signed)
NAME:  Leah [name], Leah [name]:  [id number]   MEDICAL RECORD NO.:  [id number]          PATIENT TYPE:  EMS   LOCATION:  ED                            FACILITY:  [facility]   PHYSICIAN:  Vania Rea, M.D. DATE OF BIRTH:  04/23/1918   DATE OF CONSULTATION:  03/25/2004  DATE OF DISCHARGE:                                   CONSULTATION   REFERRING PHYSICIAN:  Rhae Lerner. Margretta Ditty, M.D.   PRIMARY CARE PHYSICIAN:  Annia Friendly. [name], M.D.   REASON FOR CONSULTATION:  Dizziness and falls.   HISTORY OF PRESENT ILLNESS:  This is an 77 year old African American lady  with a history of hypertension.  She is status post CVA in 1975 with  residual mild right hemiparesis, hyperlipidemia and osteoarthritis and has  been having episodes of dizziness for the past two days.  She actually fell  without loss of consciousness and was brought to the emergency room two days  ago where she was treated and sent home.  She returns this evening because  of recurrence of dizziness.  She says this dizziness is typical of how she  feels when her blood pressure is elevated.  When first seen in the emergency  room, her blood pressure was noted to be 206/83 with rest and with one dose  of Antivert her blood pressure currently is 174/76.  She says the dizziness  continues and is unrelated to head movements.   The patient also complains of an upper respiratory infection with sneezing  and cough productive of a yellow sputum for the past week.  She did not take  her flu shot and she has taken no medications for this upper respiratory  infection.  She does have episodic headaches but is not having a headache  now, having no chest pains or shortness of breath.  Her ability to ambulate  with a cane or a walker is unimpaired she says since the advent of this  dizziness.  She does not use alcohol or any other drugs.  She lives alone at  home but has a health aide who comes in five days a week to give her some  assistance.  She has two sisters who live in a nursing home.  She does not  wish to live in a nursing home.  She is quite content to be where she is and  is ready to go home now if need be.   PAST MEDICAL HISTORY:  1.  Essential hypertension.  2.  History of hypertensive urgency, admitted with a blood pressure of      256/106 in August 2004.  3.  Ischemic stroke in 1975 with residual right hemiparesis.  4.  Hyperlipidemia.  5.  Osteoarthritis.   MEDICATIONS:  1.  Enalapril 10 mg daily, additional enalapril/hydrochlorothiazide 10/25 mg      daily.  2.  Zocor 40 mg at bedtime.  3.  Klor-Con 20 mEq daily.  4.  Aspirin 325 mg daily.  5.  Catapres TTS-3 one patch q. Mondays.   ALLERGIES:  No known drug allergies.   SOCIAL HISTORY:  She is  a disabled widow who lives in a senior citizens  home.  See H&P for other details.   FAMILY HISTORY:  She has a family history of stroke, dementia, hypertension,  and atherosclerotic heart disease.   REVIEW OF SYSTEMS:  On a 10-point review of systems, the patient denies all  problems other than those noted in the admission history and physical.   PHYSICAL EXAMINATION:  GENERAL:  This is a pleasant African American lady  lying in bed.  VITAL SIGNS:  Temperature 96.9, pulse 58, blood pressure 174/76,  respirations 20.  She is having no pain.  She is saturating at 99% on room  air.  HEENT:  Her pupils are surgical but reactive to light.  Mucous membranes are  pink, moist and anicteric.  Light reflex is present bilaterally in both  ears.  She has no mastoid or facial tenderness.  NECK:  She has no lymphadenopathy, no jugular venous distension, no  thyromegaly.  CHEST:  Clear to auscultation bilaterally.  CARDIOVASCULAR:  She has a regular rhythm.  ABDOMEN:  Is mildly obese, soft and nontender.  EXTREMITIES:  Without edema.  She has 2+ pulses bilaterally.  NEUROLOGIC:  Mild right hemiparesis  PSYCH: Alert and oriented X3; no evidence of  depression or anxiety.   LABORATORY DATA:  Serum chemistry is unremarkable.  BUN 16, creatinine 0.9.  CBC is likewise completely normal.  Hemoglobin 13.1.  Urinalysis is  completely bland with a specific gravity of 1.005, negative leukocyte  esterase and nitrites.  Chest x-ray shows no acute abnormality.  A CT scan  of her head shows no acute abnormality.   ASSESSMENT:  1.  Uncontrolled  hypertension.  2.  Upper respiratory infection.  3.  Remote history of stroke.  4.  History of hyperlipidemia.   RECOMMENDATIONS:  1.  Since this lady has no impaired mobility, she is able to cooperate fully      with the exam and is able to ambulate very well.  She is in no pain.      Recommend continue current hypertensive regimen.  Give a dose of Norvasc      now and Norvasc 10 mg daily to her medical regimen.  She is to follow up      with her primary care physician in two days time.  2.  For her upper respiratory infection with yellow sputum, we will      recommend Z-pack, Clarinex and Robitussin.      LC/MEDQ  D:  03/26/2004  T:  03/26/2004  Job:  [id number]

## 2010-06-19 NOTE — Consult Note (Signed)
NAME:  Leah [name], Leah [name]:  [id number]   MEDICAL RECORD NO.:  [id number]          PATIENT TYPE:  INP   LOCATION:  A227                          FACILITY:  [facility]   PHYSICIAN:  Vida Roller, M.D.   DATE OF BIRTH:  [birth year]   DATE OF CONSULTATION:  07/06/2005  DATE OF DISCHARGE:                                   CONSULTATION   PRIMARY:  Dr. Mirna Mires   HISTORY OF PRESENT ILLNESS:  Ms. Deleon is an 77 year old woman who was  admitted on the 4th with hypertension out of control.  She was found on  telemetry to have bradycardia while she was asleep and we were asked to  evaluate her for her low heart rate.  She denies any symptoms, says that she  does not have any chest pain, shortness of breath, or palpitations.  She has  not lost consciousness.  She states that she has not had any trouble with  ambulation other than the fact that she has had a stroke in the past and has  a little bit of trouble from that point of view, but no orthostasis, no PND  or orthopnea.  Occasionally gets lower extremity edema.  Her blood pressure  was elevated to 250/100 when she was admitted to the hospital and she was  treated with her outpatient medications and this resolved.  Her past medical  history is significant for hypertension.  She has had hypertensive urgency  admissions in 2004 and 2006.  She had a stroke back in 1975 and has residual  left hemiparesis.  She has DJD, hyperlipidemia.  She has had a hysterectomy,  appendectomy, and cataract surgery all of which were without complication.  She lives in [place] by herself.  She has an aide that comes and helps  her one day a week.  She does not smoke, drink, or use illicit drugs and  never has.  Her mother lived to be 34 and died of a stroke.  Father died at  age 63 of unknown causes.  She has four sisters all of whom have passed  away, none of them with coronary disease and three brothers who have passed  away, she thinks,  of cancer.  One of them it is unknown what he died of.   MEDICATIONS:  1.  Tylenol 500 mg twice a day for arthritis.  2.  Xanax 0.25 mg q.h.s.  3.  Aspirin 325 once a day.  4.  Clonidine-TTS patch #3 once a week.  5.  Colace 200 mg at evening.  6.  Vasotec 10 mg once a day.  7.  Pepcid 20 mg once a day.  8.  Hydrochlorothiazide 25 mg a day.  9.  Cozaar 100 mg a day.  10. K-Dur 20 mEq once a day.  11. Zocor 40 mg q.h.s.   PHYSICAL EXAMINATION:  GENERAL:  She is an elderly black female in no  apparent distress who is very, very polite and very delightful to speak  with.  VITAL SIGNS:  Her pulse is 52 and regular, respirations are 19.  Her blood  pressure is 130/67.  In reviewing her telemetry strips we see no heart rate  less than 39 beats a minute and this was when she was asleep.  She weighs  131 pounds.  HEENT:  Slight facial asymmetry, but otherwise is unremarkable.  NECK:  Supple.  There is no jugular venous distension or carotid bruits.  CARDIOVASCULAR:  Regular.  She has a 2/6 systolic murmur.  No S3, but an S4.  LUNGS:  Clear to auscultation.  GU:  Deferred.  BREASTS:  Deferred.  RECTAL:  Deferred.  ABDOMEN:  Soft, nontender.  Normoactive bowel sounds.  EXTREMITIES:  Lower extremities are without significant clubbing, cyanosis,  or edema.  Pulses are 1+  MUSCULOSKELETAL:  Unremarkable.  NEUROLOGIC:  Residual left hemiparesis, but is otherwise unremarkable.   Chest x-ray shows no acute cardiopulmonary disease.  Electrocardiogram shows  sinus bradycardia, rate of 52 with mild left axis deviation, normal PR and  QRS durations.  QT is 442 which is within normal limits.  No LVH,  nonspecific ST-T wave changes.   LABORATORIES:  White blood cell count 6, H&H of 14 and 42, platelet count  251.  Sodium 140, potassium 3.5, chloride 107, bicarbonate 25, BUN 18,  creatinine 0.8, blood sugar 114.  LFTs are all normal.  Cardiac enzymes x1  are negative.   When we get her up and  walk her around her heart rate goes up into the 80s,  comes back down into the 50s when she rests.  She is asymptomatic during  that period of time.  We have a lady with asymptomatic bradycardia who is  chronotropically competent who has a heart murmur.  I think we probably  ought to get an echocardiogram to assess the heart murmur.  It would be  reasonable, I think, to complete her evaluation by getting a TSH.  If these  are normal I think it is probably reasonable not to pursue any further  evaluation in this lady who is otherwise doing just fine.      Vida Roller, M.D.  Electronically Signed     JH/MEDQ  D:  07/06/2005  T:  07/07/2005  Job:  [id number]   cc:   Annia Friendly. [name], MD  Fax: [phone]

## 2010-06-19 NOTE — Discharge Summary (Signed)
[name], [name]              ACCOUNT NO.:  [id number]   MEDICAL RECORD NO.:  [id number]          PATIENT TYPE:  INP   LOCATION:  A309                          FACILITY:  [facility]   PHYSICIAN:  Tesfaye D. Felecia Shelling, MD   DATE OF BIRTH:  17-Oct-1918   DATE OF ADMISSION:  11/08/2007  DATE OF DISCHARGE:  10/12/2009LH                               DISCHARGE SUMMARY   DISCHARGE DIAGNOSES:  1. Bradyarrhythmia.  2. Hypertension.  3. Status post cerebrovascular accident.  4. Dehydration.  5. History of gastroenteritis.  6. Hyperlipidemia.   DISPOSITION:  The patient was discharged to assisted-living home.   DISCHARGE MEDICATIONS:  1. Omeprazole 20 mg daily.  2. Simvastatin 40 mg daily.  3. Cozaar 100 mg daily.  4. Xanax 0.25 mg daily.  5. Oxybutynin 5 mg b.i.d.  6. Hydralazine 50 mg q.6 h.  7. Catapres-TTS-3 patch weekly.  8. Hydrochlorothiazide 25 mg daily.  9. Norvasc 10 mg daily.  10.MiraLax 17 g daily as needed.  11.Lortab 5/500, 1 tablets p.o. q.6 h. p.r.n.   HOSPITAL COURSE:  This is an 77 year old female patient with history of  multiple medical illnesses who was brought to the emergency room due to  elevated blood pressure.  During evaluation in emergency room, her  systolic blood pressure was 135.  She had also significant bradycardia  in the range of 40 beats per minute.  The patient had no chest pain or  shortness of breath.  She was admitted and was monitored under  telemetry.  Her medications were adjusted.  Cardiology consult was done  and was evaluated for bradycardia.  The patient was advised to continue  with medical treatment, and the patient was discharged in stable  condition.     Tesfaye D. Felecia Shelling, MD  Electronically Signed    TDF/MEDQ  D:  12/07/2007  T:  12/07/2007  Job:  [id number]

## 2010-06-19 NOTE — H&P (Signed)
Leah [name], [name]                          ACCOUNT NO.:  [id number]   MEDICAL RECORD NO.:  [id number]                   PATIENT TYPE:  AMB   LOCATION:  DAY                                  FACILITY:  [facility]   PHYSICIAN:  Jerolyn Shin C. Katrinka Blazing, M.D.                DATE OF BIRTH:  Apr 11, 1918   DATE OF PROCEDURE:  DATE OF DISCHARGE:                      STAT - MUST CHANGE TO CORRECT WORK TYPE   HISTORY OF PRESENT ILLNESS:  An 77 year old female referred for screening  colonoscopy. She denies history of bowel movements, appetite is good. There  is no history of hematochezia or melena. No family history of colon cancer  or polyps. She is scheduled for a staining study.   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 She also has  hypertension and osteoarthritis.   PAST SURGICAL HISTORY:  Hysterectomy.   MEDICATIONS:  1. Catapres-TTS 3 q. 7 days  2. Enalapril  HCT 10/25 q.d.  3. Labetalol 100 mg 2 q.d.  4. Lescol 40 mg q.h.s.  5. DynaCirc 5 mg b.i.d.  6. Aspirin 1 q.d.   PHYSICAL EXAMINATION:   GENERAL:  She is a pleasant female in no acute distress.   VITAL SIGNS:  Blood pressure 133/78, pulse 70, respirations 18, weight 128  pounds.   HEENT:  Unremarkable except for mild asymmetry of the face.   NECK:  Supple without JVD or bruit.   CHEST:  Clear to auscultation.   HEART:  Regular rate and rhythm without murmur, gallop or rub.   ABDOMEN:  Soft, nontender, no masses.   RECTAL:  Normal, stool guaiac negative.   EXTREMITIES:  Unremarkable except for spastic plegia at the right side.   NEUROLOGIC:  Cranial nerves intact. Gait altered by paresis on the right.  Decreased strength on the right with hyperreflexia on the right. Sensation  appears to be intact.   IMPRESSION:  1. Need for screening colonoscopy  2. Hypertension  3. Osteoarthritis  4. History of cerebrovascular insufficiency status  post left cerebrovascular     accident with a right hemiparesis.   PLAN:  Screening colonoscopy                                                Jerolyn Shin C. Katrinka Blazing, M.D.    LCS/MEDQ  D:  08/30/2001  T:  08/30/2001  Job:  [id number]

## 2010-06-19 NOTE — Discharge Summary (Signed)
NAME:  Leah [name], Leah [name]              ACCOUNT NO.:  [id number]   MEDICAL RECORD NO.:  [id number]          PATIENT TYPE:  INP   LOCATION:  A327                          FACILITY:  [facility]   PHYSICIAN:  Tesfaye D. Felecia Shelling, MD   DATE OF BIRTH:  18-Mar-1918   DATE OF ADMISSION:  09/22/2007  DATE OF DISCHARGE:  08/25/2009LH                               DISCHARGE SUMMARY   DISCHARGE DIAGNOSES:  1. Dehydration secondary to fluid depletion.  2. Gastroenteritis.  3. Hypertension.  4. Status post CVA.  5. Hyperlipidemia.   DISCHARGE MEDICATIONS:  1. Simvastatin 20 mg daily.  2. Omeprazole 20 mg daily.  3. Lisinopril 20 mg daily.  4. Hydralazine 25 mg t.i.d.  5. Cozaar 100 mg daily.  6. Catapres-TTS 3 patch q. weekly.   DISPOSITION:  The patient was discharged to home in stable condition.   HOSPITAL COURSE:  This is an 77 year old female patient with history of  multiple medical illnesses who was admitted due to nausea, vomiting, and  generalized weakness.  She had no hematemesis, melena, or diarrhea.  On  admission her BUN and creatinine were slightly elevated.  The patient  was gradually rehydrated with IV fluids and her diuretics was stopped.  The patient improved and she was discharged to home in stable condition.      Tesfaye D. Felecia Shelling, MD  Electronically Signed     TDF/MEDQ  D:  10/24/2007  T:  10/24/2007  Job:  [id number]

## 2010-06-19 NOTE — H&P (Signed)
[name], [name]:  [id number]   MEDICAL RECORD NO.:  [id number]                   PATIENT TYPE:  OBV   LOCATION:  IC09                                 FACILITY:  [facility]   PHYSICIAN:  Leah [name], M.D.                DATE OF BIRTH:  07/16/1918   DATE OF ADMISSION:  01/11/2002  DATE OF DISCHARGE:                                HISTORY & PHYSICAL   The patient is an 77 year old widowed, gravida 1, para 0, AB 1, disabled  black female from [place], [place], [place].   CHIEF COMPLAINT:  Generalized weakness on the day of admission.  Weakness  was felt especially in the right arm and leg.  The patient also complained  of dry cough, irritation of throat, general malaise, and nasal congestion  less than 24 hours.  She denied dysarthria, dysphagia, runny nose, nausea,  vomiting, diarrhea, diplopia, headache, syncope, and dizziness.   PAST MEDICAL HISTORY:  1. Positive for chronic right hemiparesis secondary to old left CVA in 1975.  2. Hypertension.  3. Hyperlipidemia.  4. Osteoarthritis.  5. Negative for diabetes, tuberculosis, cancer, sickle cell, asthma, or     seizure disorder.  In the emergency room she had a CT of the head which was read as dilated  ventricle, cerebral atrophy, no bleed, and chronic small vessel disease by  radiologist.   MEDICATIONS ON ADMISSION:  1. Catapres TTS-3 patch every seven days.  2. DynaCirc 5 mg p.o. b.i.d.  3. Labetalol 100 mg two tablets p.o. every day.  4. Vaseretic 10/25 p.o. every day.  5. Lescol 40 mg p.o. every day.  6. One aspirin 325 mg p.o. every day.   ALLERGIES:  The patient is not allergic to any known medications.   HABITS:  Negative for use of tobacco products, ethanol, or street drugs.   SEXUALLY TRANSMITTED DISEASE HISTORY:  Negative for gonorrhea, syphilis,  Herpes, and HIV infection.   PAST MEDICAL HISTORY:  1. Positive hospitalization in 1975 at [facility]  [place]     [place].  2. Appendectomy at [facility].  3. Hysterectomy in [place] secondary to heavy menstrual bleeding.   FAMILY HISTORY:  Mother deceased at age 25 secondary to complication of  strokes; father deceased age 53, secondary to cancer (type unknown); three  sisters living, age 26, health unknown, one in her 65s with history of  hypertension and dementia, and age 63 with history of hypertension and  severe dementia; three sisters deceased as an infant secondary to pneumonia,  and two sisters deceased, cause unknown.  Three brothers deceased, one in  his 5s secondary to cancer, others brothers secondary to heart attack and  leukemia.   REVIEW OF SYSTEMS:  Positive chronic right hemiparesis and occasional  headache.  Review of systems is negative for epistaxis, dysphagia, chronic  cough, hemoptysis, stomach pain,  hematemesis, urinary incontinence, fecal  incontinence, swelling of legs, unexplained weight loss.  Review of systems  is positive for episodic tingling of her right hand, right shoulder pain,  etc.   PHYSICAL EXAMINATION:  VITAL SIGNS:  On the floor, temperature 100.1, blood  pressure 195/86, pulse 72, and respirations 20.  GENERAL APPEARANCE:  Revealed an elderly, slightly short, medium frame,  alert, black female who appeared not to feel well but no apparent  respiratory distress.  HEENT:  Head normocephalic, ears normal auricle, external canals patent  tympanic membranes, pearly gray.  Eyes:  Lids negative for ptosis.  Sclerae  white.  Pupils round, equal, and reactive to light.  Extraocular movements  intact.  Nose:  Negative for discharge.  Mouth:  No droop of corner.  Tongue  midline.  Dentition fair with missing teeth.  Gag reflex intact.  Posterior  pharynx benign.  NECK:  Negative for lymphadenopathy or thyromegaly.  LUNGS:  Clear.  HEART:  Audible S1 and S2 without murmur, rub, or gallop.  Regular rate and   rhythm.  BREASTS:  No skin changes, no nodules on palpation.  __________  discharge.  ABDOMEN:  No distention.  Possible old healed mid hypogastric surgical  scars.  Soft, nontender, all four quadrants.  No palpable mass, no  organomegaly.  PELVIC:  Deferred.  RECTAL:  No external lesions.  Digital exam positive for small amount of  formed stool in rectal vault.  No palpable rectal vault masses.  Stool  guaiac negative.  EXTREMITIES:  No edema.  Palpable femoral arteries bilaterally.  Nonpalpable  right dorsalis pedis, palpable left dorsalis pedis.  NEUROLOGIC:  Alert, oriented to person, place, and time.  Speech clear and  appropriate.  Cranial nerves II-XII appeared intact.  Strength left +4  upper, right upper +2 in strength.  Cannot extend right upper extremity  maximum.  Lower extremity, left +4, right +3.  Tactile and proprioception  intact.   LABORATORY DATA:  White count 7.3, hemoglobin 13.4, hematocrit 40.4,  platelets 258,000.  Prothrombin time 13.3, INR 1.0.  Possible thromboplastin  time 29.  Glucose 110, BUN 15, creatinine 0.9.  Sodium 135, potassium 3.6,  chloride 98, CO2 28, calcium 9.2.  Total protein 6.3, albumin 3.6, SGOT 20,  SGPT 14, alkaline phosphatase 56, total bilirubin 1.1.  Total CPK 114, CK-MB  1.0, troponin I 0.   IMPRESSION:  Primary acute general malaise and weakness secondary to acute  viral syndrome.   SECONDARY DIAGNOSES:  1. Chronic right hemiparesis secondary to old left cerebrovascular accident.  2. Hypertension.  3. Hyperlipidemia.  4. Osteoarthritis.   PLAN:  1. Telemetry x24 hours.  2. IV fluids.  3. Neuro check every 12-24 hours.  4. Diet:  Full liquids.  5. Case worker consult.  6. Activity:  Bedrest x8 hours.  7. Urinalysis.  8. Fasting lipid profile   MEDICATIONS:  1. Tamiflu 75 mg p.o. b.i.d. x5 days.  2. Zithromax Dosepak.  3. Antitussive medication. 4. Labetalol 100 mg two tablets p.o. b.i.d.  5. Vaseretic 10/25 p.o. every  day.  6. DynaCirc 5 mg p.o. b.i.d.  7. One aspirin p.o. every day.  8. Lescol 40 mg p.o. every day.  Leah [name], M.D.    Levonne Hubert  D:  01/11/2002  T:  01/11/2002  Job:  [id number]

## 2010-06-19 NOTE — Discharge Summary (Signed)
Leah [name], HILSCHER NO.:  [id number]   MEDICAL RECORD NO.:  [id number]          PATIENT TYPE:  INP   LOCATION:  A227                          FACILITY:  [facility]   PHYSICIAN:  Annia Friendly. Leah Chance, MD     DATE OF BIRTH:  Jun 06, 1918   DATE OF ADMISSION:  07/05/2005  DATE OF DISCHARGE:  06/06/2007LH                                 DISCHARGE SUMMARY   HISTORY OF PRESENT ILLNESS:  The patient was an 77 year old widowed gravida  1, para 0, AB 1 black female from [place], [place].  Chief  complaint was weakness, general malaise and headache.  The patient had been  experiencing these symptoms over three days.  Headache was frontal and  described as mild ache.  She described weakness felt upon ambulating.  She  denied double vision, chest pain, shortness of breath, syncope, dizziness,  palpitations, chronic cough, orthopnea and diarrhea.  The patient was  complaining of increased urge to void over the past 24 hours without dysuria  or gross hematuria.  She has been experiencing swelling  of both legs  greater than 15 days.   PAST MEDICAL HISTORY:  1.  Hypertension.  2.  Chronic right paresis secondary to old left CVA.  3.  Degenerative joint disease.  4.  Hyperlipidemia.   PAST SURGICAL HISTORY:  1.  Hospitalization in 1975 at [facility] for hysterectomy      secondary to metrorrhagia.  2.  Appendectomy; Hypertensive urgency in August 2004.  3.  Stroke in 1975.  4.  Viral syndrome with right upper lobe infiltrate March 1993.  5.  Cataract surgery by Dr. Nile Riggs.   HABITS:  Positive former use of ethanol 1975 and negative for use of tobacco  products.   ALLERGIES:  No known drug allergies.   FAMILY HISTORY:  Mother deceased at age 55 secondary to complications of  stroke.  Father deceased at age 69, cause unknown.  Sisters deceased (two in  their 103's secondary to sepsis and Alzheimer's disease), one sister  secondary to pneumonia as an infant and  other sister deceased cause unknown.  Three brothers deceased secondary to cancer and causes unknown.   PROBLEMS:  1.  HYPERTENSIVE URGENCY.  General appearance revealed an elderly, slightly      short, small framed black female in no apparent respiratory distress.      Vitals on admission were as follows:  Temperature 97.1, blood pressure      240/100, pulse 80, respirations 24.  Head normocephalic.  Eyes:      Negative for ptosis.  Sclerae white.  Extraocular movements intact.      Nose:  Negative at discharge.  Lungs:  Clear.  Heart:  audible S1 and S2      with systolic murmur grade I/VI, regular rhythm, rate of 60.      Extremities tibia and ankle positive for mild pedal edema.  Significant      labs on admission were as follows:  White count 6.0, hemoglobin 14.0,      hematocrit 41.7, platelets 251,000.  Sodium 140, potassium 3.5, chloride  107, CO2 25, glucose 114, BUN 18, creatinine 0.8, calcium 9.3, total      protein 7.0, albumin 3.7, AST 28, ALT 23, ALP 62, total bilirubin 0.8,      total CPK 153, CK MB 3.2, troponin I 0.01, T4 8.4 mcg/dL, TSH 0.454,      mIU .  Urinalysis:  Specific gravity 1.015, pH 6.0, no glucose, no      hemoglobin, no bilirubin, no ketones, no protein, nitrate negative.  EKG      on admission was read as sinus bradycardia.  Q waves in the AVF, poor R      wave progression lead V1-V3.  Abnormal electrocardiogram as read by Dr.      Kari Baars.  The patient was admitted to telemetry, insertion of      heparin lock, treated with Cozaar 100 mg p.o. daily, Catapres TTS/3 q.7      days, Enalapril HCTZ 10/25 p.o. daily.  The patient responded to      therapy.  She was also put on a low sodium diet.  Dietician was asked to      see the patient during this hospitalization.  Blood pressure on the      morning of discharge was 144/65 and pulse was 63 at rest.  The patient      was alert and oriented to person, place and time.  She had no complaints      of  headache, dizziness, chest pain or shortness of breath.  The patient      was discharged to her home on July 07, 2005.   #2 - CHRONIC RIGHT HEMIPLEGIA SECONDARY TO OLD LEFT CVA.  The patient had  weakness involving right arm and leg at the time of admission.  She could  raise right arm above heart.  She could lift right foot off floor.  She had  a slight right foot drag during ambulation which was slow with use of a  cane.  She had physical therapy during this hospitalization.  She was  observed ambulating down the hallway under supervision of physical therapy  on the morning of discharge.  The patient ambulated 100 feet.  Arrangements  will be made by Physical Therapy Department at [facility] for the  patient to be fitted for a right knee brace by [employer].   #3 - DEGENERATIVE JOINT DISEASE.  The patient was advised to take Tylenol  500 mg p.o. b.i.d. and t.i.d.  The patient, on exam, had no joint swelling,  redness or hotness.  She had some stiffness of her right shoulder and right  elbow.   #4 - HYPERLIPIDEMIA.  The patient was treated with Simvastatin 40 mg p.o.  q.h.s.  Her liver function tests were within normal limits during this  admission.  She had no complaints of muscle aches during this  hospitalization.  In addition, she was treated with low sodium and  cholesterol restrictions during this hospitalization.  Her liver function  tests and liver profile will be done periodically as outpatient.   #5 - ASYMPTOMATIC BRADYCARDIA.  EKG on admission demonstrated sinus  bradycardia with a rate of 52 on July 05, 2005.  Thyroid function was within  normal limits.  Cardiology did see the patient during her hospitalization  and felt that bradycardia was asymptomatic and the patient was  Chronotropically competent according  to Dr. Dorethea Clan, Cardiologist. Echocardiogram was also ordered during this hospitalization and result  ispending at time of  discharge.   DISCHARGE INSTRUCTIONS:  1.  Diet:  Low sodium and cholesterol.  2.  Activity:  Ambulate with cane or walker.   DISCHARGE MEDICATIONS:  1.  K-Dur 20 mEq p.o. daily.  2.  Enalapril 10 mg p.o. daily.  3.  Cozaar 100 mg p.o. daily.  4.  Simvastatin 40 mg p.o. q.h.s.  5.  Calcium 600 mg p.o. b.i.d.  6.  Catapres TTS 3 one patch q.7 days.  7.  Enalapril HCTZ 10/25 one tablet p.o. daily.   FOLLOWUP:  Follow up with Dr. Loleta [name] in one week.   FINAL PRIMARY DIAGNOSIS:  Hypertensive urgency.   SECONDARY DIAGNOSES:  1.  Parental right hemiparesis secondary to old left CVA.  2.  Degenerative joint disease.  3.  Hyperlipidemia.  4.  Asymptomatic bradycardia.      Annia Friendly. Leah Chance, MD  Electronically Signed     GKH/MEDQ  D:  07/07/2005  T:  07/08/2005  Job:  [id number]

## 2010-06-19 NOTE — Procedures (Signed)
Leah [name], POSS NO.:  [id number]   MEDICAL RECORD NO.:  [id number]          PATIENT TYPE:  INP   LOCATION:  A227                          FACILITY:  [facility]   PHYSICIAN:  Cristy Hilts. Jacinto Halim, MD       DATE OF BIRTH:  [birth year]   DATE OF PROCEDURE:  07/06/2005  DATE OF DISCHARGE:                                  ECHOCARDIOGRAM   PROCEDURE PERFORMED:  Echocardiogram.   INDICATION:  Hypertension, lower extremity edema, fluid overload state, and  aortic valve stenosis.   MEASUREMENTS:  Aortic root measured 2.9 cm.   The left atrium is 3.4. cm.   The septum is 1.6 cm.   The posterior wall is 1.4 cm.   Left ventricular outflow diameter is 1.8 cm.   Left ventricle diastolic dimension is 4.0 cm.   Left ventricular systolic dimension is 2.9 cm   Ejection fraction is 60% to 65%.   LEFT ATRIUM:  The left atrium is normal.   LEFT VENTRICLE:  The left ventricle shows normal segmental wall motion.  There is moderate left ventricular hypertrophy. There is moderate basal  septal hypertrophy.   RIGHT ATRIUM:  The right atrium is normal.   RIGHT VENTRICLE:  The right ventricle is normal.   PERICARDIUM:  The pericardium is normal.   AORTIC VALVE:  The aortic valve shows mild to moderate aortic valve  calcification. There is mild aortic stenosis. The calculated aortic valve  area was 1.1 cm2 and the peak gradient across the calcified aortic valve is  23 mmHg with a mean of 17.6 mmHg consistent with mild aortic valve stenosis.   There is mild aortic regurgitation.   MITRAL VALVE:  The mitral valve shows mitral calcification. There is  calcification of the posterior papillary muscle. There is trivial mitral  valve regurgitation.   PULMONARY ARTERY:  The pulmonary artery is not well visualized.   TRICUSPID VALVE:  The tricuspid valve is normal with trivial tricuspid valve  regurgitation.   FINAL INTERPRETATION:  1.  Normal left ventricular systolic function with  an ejection fraction of      60% to 65%.  2.  Moderate left ventricular hypertrophy, moderate basal septal      hypertrophy.  3.  Mild aortic valve stenosis by pressure gradient. The peak gradient      across the calcified aortic valve was 23 mmHg with a mean of 17.6 mmHg.      The calculated aortic valve area is 1.1 cm2.  There is mild aortic valve      regurgitation.  4.  Poor echo window.      Cristy Hilts. Jacinto Halim, MD  Electronically Signed     JRG/MEDQ  D:  07/06/2005  T:  07/06/2005  Job:  [id number]   cc:   Annia Friendly. [name], MD  Fax: [phone]   [facility]

## 2010-06-19 NOTE — H&P (Signed)
NAMECELESTA, FUNDERBURK NO.:  [id number]   MEDICAL RECORD NO.:  [id number]          PATIENT TYPE:  INP   LOCATION:  A227                          FACILITY:  [facility]   PHYSICIAN:  Annia Friendly. [name], MD     DATE OF BIRTH:  1918-08-09   DATE OF ADMISSION:  07/05/2005  DATE OF DISCHARGE:  LH                                HISTORY & PHYSICAL   IDENTIFYING DATA:  The patient is an 77 year old, widowed, gravida 1, para  0, AB 1, black female from [place], [place].  She is complaining  of weakness, general malaise, and headache.   HISTORY OF PRESENT ILLNESS:  The patient has been experiencing symptoms over  three days.  The headache is frontal and is described as a mild ache.  She  describes a weakness felt upon ambulating.  She denies diplopia, chest pain,  shortness of breath, syncope, dizziness, palpitations, chronic cough,  orthopnea, and diarrhea.  The patient is complaining about increased urge of  voiding the past 24 hours without dysuria or gross hematuria.  She has been  experiencing swelling of both legs greater than 15 days.   PAST MEDICAL HISTORY:  1.  Hypertension.  2.  Chronic right hemiparesis secondary to old left CVA.  3.  Degenerative joint disease.  4.  Hyperlipidemia.  5.  Negative for diabetes.  6.  Negative for tuberculosis.  7.  Negative for cancer.  8.  Negative for asthma.  9.  Negative for seizure disorder.  10. Positive for hospitalization in 1975 at [facility] for a      hysterectomy secondary to metrorrhagia.  11. Appendectomy.  12. Hypertensive urgency in August 2004.  13. Stroke in 1975.  14. Viral syndrome with right upper lobe infiltrate in March 1993.  15. Cataract extraction by Dr. Nile Riggs.   HABITS:  Positive for former ethanol use since 1975, and negative for use of  tobacco products and street drugs.   ALLERGIES:  The patient is not allergic to any known medication.   FAMILY HISTORY:  Mother deceased, age 55,  secondary to complications of  stroke; father deceased, age 32, cause unknown; four sisters deceased, two  in their 21s secondary to sepsis and Alzheimer's disease, one sister  secondary to pneumonia as an infant, and the other sister deceased, cause  unknown; three brothers deceased secondary to cancer and causes unknown.   REVIEW OF SYSTEMS:  Positive for chronic weakness of right lower extremity,  episodic multiple joint pain and stiffness, urinary incontinence on the day  of admission.  Review of systems negative for epistaxis, dysphagia, chronic  cough, wheezing, orthopnea, hemoptysis, vaginal bleeding, vaginal itching,  hematemesis, melena, weight loss, unexplained fever, etc.  Review of systems  is positive for mild constipation, also.   PHYSICAL EXAMINATION:  GENERAL APPEARANCE:  An elderly, slightly short,  small frame, black female in no apparent respiratory distress.  VITAL SIGNS:  Blood pressure 249/100, temperature 97.1, pulse 54,  respirations 20.  HEAD:  Normocephalic.  EARS:  Normal auricle, external canal patent, tympanic membrane pearly gray.  EYES:  Lid  negative for ptosis, sclerae white, pupils round , extraocular  movement intact.  NOSE:  Negative for discharge.  MOUTH:  No oral lesions, dentition fair, positive for missing teeth, no  bleeding gum, posterior pharynx benign.  NECK:  Negative for adenopathy or thyromegaly.  LUNGS:  Clear.  HEART:  Audible S1 and S2 with a systolic murmur, grade 1/6, a regular  rhythm, rate  equals60.  BREASTS:  No skin changes.  ABDOMEN:  No distention, old mid multiple hypogastric surgical scars, soft  and nontender in all four quadrants, no palpable masses or organomegaly.  PELVIC AND RECTAL:  Deferred.  EXTREMITIES:  Tibia and ankle positive for pitting edema (mild), right leg  positive for some atrophy.  Palpable left dorsalis pedis, nonpalpable right  dorsalis pedis.  NEUROLOGIC:  Alert and oriented to person, place, and  time.  Cranial nerves  II-XII appeared intact.  Reflexes in the knees, right +4, left +2;  ambulation - positive for right foot drag   IMPRESSION:  Hypertensive urgency.   SECONDARY DIAGNOSES:  1.  Polyarthritis.  2.  Chronic right hemiparesis secondary to old left cerebrovascular      accident.  3.  Hyperlipidemia.   PLAN:  Admit to telemetry, Hep-lock, antihypertensive meds, dietitian  consult, caseworker consult, thyroid labs, CBC, Chem-7, urinalysis, liver  function tests, cardiac enzymes, echocardiogram, one aspirin every day,  activity out of bed only with assistance and bedside commode, EKG, and chest  x-ray.      Annia Friendly. [name], MD  Electronically Signed     GKH/MEDQ  D:  07/05/2005  T:  07/05/2005  Job:  [id number]

## 2010-06-22 ENCOUNTER — Emergency Department (HOSPITAL_COMMUNITY): Payer: PRIVATE HEALTH INSURANCE

## 2010-06-22 ENCOUNTER — Emergency Department (HOSPITAL_COMMUNITY)
Admission: EM | Admit: 2010-06-22 | Discharge: 2010-06-22 | Disposition: A | Discharge status: Home / Self Care | Payer: PRIVATE HEALTH INSURANCE | Attending: Emergency Medicine | Admitting: Emergency Medicine

## 2010-06-22 DIAGNOSIS — Z79899 Other long term (current) drug therapy: Secondary | ICD-10-CM | POA: Insufficient documentation

## 2010-06-22 DIAGNOSIS — S1093XA Contusion of unspecified part of neck, initial encounter: Secondary | ICD-10-CM | POA: Insufficient documentation

## 2010-06-22 DIAGNOSIS — E7800 Pure hypercholesterolemia, unspecified: Secondary | ICD-10-CM | POA: Insufficient documentation

## 2010-06-22 DIAGNOSIS — S0003XA Contusion of scalp, initial encounter: Secondary | ICD-10-CM | POA: Insufficient documentation

## 2010-06-22 DIAGNOSIS — R05 Cough: Secondary | ICD-10-CM | POA: Insufficient documentation

## 2010-06-22 DIAGNOSIS — R059 Cough, unspecified: Secondary | ICD-10-CM | POA: Insufficient documentation

## 2010-06-22 DIAGNOSIS — Z8673 Personal history of transient ischemic attack (TIA), and cerebral infarction without residual deficits: Secondary | ICD-10-CM | POA: Insufficient documentation

## 2010-06-22 DIAGNOSIS — W010XXA Fall on same level from slipping, tripping and stumbling without subsequent striking against object, initial encounter: Secondary | ICD-10-CM | POA: Insufficient documentation

## 2010-06-22 DIAGNOSIS — Z7982 Long term (current) use of aspirin: Secondary | ICD-10-CM | POA: Insufficient documentation

## 2010-06-22 DIAGNOSIS — Y92009 Unspecified place in unspecified non-institutional (private) residence as the place of occurrence of the external cause: Secondary | ICD-10-CM | POA: Insufficient documentation

## 2010-06-22 DIAGNOSIS — I10 Essential (primary) hypertension: Secondary | ICD-10-CM | POA: Insufficient documentation

## 2010-06-22 DIAGNOSIS — K219 Gastro-esophageal reflux disease without esophagitis: Secondary | ICD-10-CM | POA: Insufficient documentation

## 2010-06-22 DIAGNOSIS — Y998 Other external cause status: Secondary | ICD-10-CM | POA: Insufficient documentation

## 2010-06-22 LAB — DIFFERENTIAL
Basophils Relative: 0 % (ref 0–1)
Lymphocytes Relative: 21 % (ref 12–46)
Lymphs Abs: 2.1 10*3/uL (ref 0.7–4.0)
Monocytes Absolute: 0.8 10*3/uL (ref 0.1–1.0)
Monocytes Relative: 8 % (ref 3–12)
Neutro Abs: 6.8 10*3/uL (ref 1.7–7.7)
Neutrophils Relative %: 68 % (ref 43–77)

## 2010-06-22 LAB — BASIC METABOLIC PANEL
Calcium: 9.8 mg/dL (ref 8.4–10.5)
Creatinine, Ser: 0.72 mg/dL (ref 0.4–1.2)
Glucose, Bld: 109 mg/dL — ABNORMAL HIGH (ref 70–99)
Potassium: 3.6 mEq/L (ref 3.5–5.1)
Sodium: 142 mEq/L (ref 135–145)

## 2010-06-22 LAB — URINALYSIS, ROUTINE W REFLEX MICROSCOPIC
Ketones, ur: NEGATIVE mg/dL
Protein, ur: NEGATIVE mg/dL
Specific Gravity, Urine: 1.02 (ref 1.005–1.030)
pH: 6.5 (ref 5.0–8.0)

## 2010-06-22 LAB — CBC
HCT: 40.9 % (ref 36.0–46.0)
Hemoglobin: 13.2 g/dL (ref 12.0–15.0)
MCH: 29.9 pg (ref 26.0–34.0)
MCHC: 32.3 g/dL (ref 30.0–36.0)
RBC: 4.42 MIL/uL (ref 3.87–5.11)
RDW: 14.5 % (ref 11.5–15.5)

## 2010-10-30 LAB — BASIC METABOLIC PANEL
CO2: 28
Chloride: 105
GFR calc Af Amer: >60
GFR calc non Af Amer: 59 — ABNORMAL LOW
Potassium: 3.6
Sodium: 140

## 2010-10-30 LAB — DIFFERENTIAL
Eosinophils Relative: 3
Lymphocytes Relative: 33
Lymphs Abs: 1.9
Monocytes Absolute: 0.5

## 2010-10-30 LAB — CBC
HCT: 38.4
Hemoglobin: 12.6
MCV: 97.1
WBC: 5.7

## 2010-10-30 LAB — TROPONIN I: Troponin I: 0.01

## 2010-10-30 LAB — URINALYSIS, ROUTINE W REFLEX MICROSCOPIC
Glucose, UA: NEGATIVE
Nitrite: NEGATIVE
Protein, ur: NEGATIVE
Urobilinogen, UA: 0.2
pH: 7

## 2010-11-02 LAB — BASIC METABOLIC PANEL
BUN: 16
BUN: 17
CO2: 25
CO2: 27
Calcium: 8.7
Calcium: 9.3
Creatinine, Ser: 1.1
GFR calc Af Amer: 57 — ABNORMAL LOW
GFR calc non Af Amer: 48 — ABNORMAL LOW
GFR calc non Af Amer: >60
Glucose, Bld: 108 — ABNORMAL HIGH
Glucose, Bld: 118 — ABNORMAL HIGH
Potassium: 3.6
Potassium: 3.7
Sodium: 144

## 2010-11-02 LAB — CBC
HCT: 36.6
Hemoglobin: 12.3
Hemoglobin: 13.3
MCHC: 33.5
MCV: 97.9
RBC: 3.73 — ABNORMAL LOW
RBC: 4.06
RDW: 14.2
WBC: 6.4

## 2010-11-02 LAB — CARDIAC PANEL(CRET KIN+CKTOT+MB+TROPI)
Relative Index: INVALID
Total CK: 70
Troponin I: 0.04

## 2010-11-02 LAB — DIFFERENTIAL
Basophils Absolute: 0
Basophils Relative: 1
Eosinophils Absolute: 0.4
Lymphocytes Relative: 18
Monocytes Absolute: 0.5
Monocytes Absolute: 0.6
Monocytes Relative: 9
Neutro Abs: 4.3

## 2010-11-02 LAB — GLUCOSE, CAPILLARY: Glucose-Capillary: 107 — ABNORMAL HIGH

## 2010-11-02 LAB — POCT I-STAT, CHEM 8
BUN: 12
Calcium, Ion: 1.14
Chloride: 107

## 2010-11-02 LAB — URINALYSIS, ROUTINE W REFLEX MICROSCOPIC
Ketones, ur: 15 — AB
Nitrite: NEGATIVE
Specific Gravity, Urine: 1.015
pH: 6

## 2010-11-02 LAB — POCT CARDIAC MARKERS
CKMB, poc: 2.2
Myoglobin, poc: 111

## 2013-05-31 ENCOUNTER — Emergency Department (HOSPITAL_COMMUNITY): Payer: PRIVATE HEALTH INSURANCE

## 2013-05-31 ENCOUNTER — Encounter (HOSPITAL_COMMUNITY): Payer: Self-pay | Admitting: Emergency Medicine

## 2013-05-31 ENCOUNTER — Emergency Department (HOSPITAL_COMMUNITY)
Admission: EM | Admit: 2013-05-31 | Discharge: 2013-06-01 | Disposition: A | Discharge status: Home / Self Care | Payer: PRIVATE HEALTH INSURANCE | Attending: Emergency Medicine | Admitting: Emergency Medicine

## 2013-05-31 DIAGNOSIS — I10 Essential (primary) hypertension: Secondary | ICD-10-CM | POA: Insufficient documentation

## 2013-05-31 DIAGNOSIS — R42 Dizziness and giddiness: Secondary | ICD-10-CM | POA: Insufficient documentation

## 2013-05-31 DIAGNOSIS — R0989 Other specified symptoms and signs involving the circulatory and respiratory systems: Principal | ICD-10-CM | POA: Insufficient documentation

## 2013-05-31 DIAGNOSIS — R062 Wheezing: Secondary | ICD-10-CM | POA: Insufficient documentation

## 2013-05-31 DIAGNOSIS — R002 Palpitations: Secondary | ICD-10-CM | POA: Insufficient documentation

## 2013-05-31 DIAGNOSIS — R0600 Dyspnea, unspecified: Secondary | ICD-10-CM

## 2013-05-31 DIAGNOSIS — R0609 Other forms of dyspnea: Secondary | ICD-10-CM | POA: Insufficient documentation

## 2013-05-31 DIAGNOSIS — R110 Nausea: Secondary | ICD-10-CM | POA: Insufficient documentation

## 2013-05-31 DIAGNOSIS — Z8719 Personal history of other diseases of the digestive system: Secondary | ICD-10-CM | POA: Insufficient documentation

## 2013-05-31 HISTORY — DX: Gastro-esophageal reflux disease without esophagitis: K21.9

## 2013-05-31 HISTORY — DX: Noninfective gastroenteritis and colitis, unspecified: K52.9

## 2013-05-31 HISTORY — DX: Essential (primary) hypertension: I10

## 2013-05-31 LAB — COMPREHENSIVE METABOLIC PANEL
ALT: 15 U/L (ref 0–35)
AST: 17 U/L (ref 0–37)
Albumin: 3.3 g/dL — ABNORMAL LOW (ref 3.5–5.2)
Alkaline Phosphatase: 91 U/L (ref 39–117)
BUN: 22 mg/dL (ref 6–23)
CALCIUM: 9.2 mg/dL (ref 8.4–10.5)
CO2: 25 mEq/L (ref 19–32)
CREATININE: 0.64 mg/dL (ref 0.50–1.10)
Chloride: 102 mEq/L (ref 96–112)
GFR calc non Af Amer: 75 mL/min — ABNORMAL LOW (ref >90)
GFR, EST AFRICAN AMERICAN: 87 mL/min — AB (ref >90)
GLUCOSE: 115 mg/dL — AB (ref 70–99)
Potassium: 3.8 mEq/L (ref 3.7–5.3)
Sodium: 141 mEq/L (ref 137–147)
TOTAL PROTEIN: 7.5 g/dL (ref 6.0–8.3)
Total Bilirubin: 0.4 mg/dL (ref 0.3–1.2)

## 2013-05-31 LAB — CBC WITH DIFFERENTIAL/PLATELET
Basophils Absolute: 0.1 10*3/uL (ref 0.0–0.1)
Basophils Relative: 1 % (ref 0–1)
EOS ABS: 0.4 10*3/uL (ref 0.0–0.7)
EOS PCT: 5 % (ref 0–5)
HCT: 37.5 % (ref 36.0–46.0)
HEMOGLOBIN: 12.5 g/dL (ref 12.0–15.0)
LYMPHS ABS: 1.8 10*3/uL (ref 0.7–4.0)
Lymphocytes Relative: 24 % (ref 12–46)
MCH: 29.8 pg (ref 26.0–34.0)
MCHC: 33.3 g/dL (ref 30.0–36.0)
MCV: 89.3 fL (ref 78.0–100.0)
MONOS PCT: 10 % (ref 3–12)
Monocytes Absolute: 0.8 10*3/uL (ref 0.1–1.0)
Neutro Abs: 4.6 10*3/uL (ref 1.7–7.7)
Neutrophils Relative %: 60 % (ref 43–77)
Platelets: 375 10*3/uL (ref 150–400)
RBC: 4.2 MIL/uL (ref 3.87–5.11)
RDW: 14.6 % (ref 11.5–15.5)
WBC: 7.6 10*3/uL (ref 4.0–10.5)

## 2013-05-31 LAB — URINALYSIS, ROUTINE W REFLEX MICROSCOPIC
Bilirubin Urine: NEGATIVE
Glucose, UA: NEGATIVE mg/dL
Hgb urine dipstick: NEGATIVE
KETONES UR: NEGATIVE mg/dL
LEUKOCYTES UA: NEGATIVE
NITRITE: NEGATIVE
PH: 6 (ref 5.0–8.0)
Protein, ur: NEGATIVE mg/dL
SPECIFIC GRAVITY, URINE: 1.025 (ref 1.005–1.030)
Urobilinogen, UA: 0.2 mg/dL (ref 0.0–1.0)

## 2013-05-31 LAB — MAGNESIUM: Magnesium: 2.2 mg/dL (ref 1.5–2.5)

## 2013-05-31 LAB — TROPONIN I: Troponin I: <0.3 ng/mL (ref <0.30)

## 2013-05-31 NOTE — Discharge Instructions (Signed)
Her tests tonight did not show any reason for her episode of shortness of breath. She almost describes a possible episode of tachycardia which resolved.  Recheck if she gets worse again.

## 2013-05-31 NOTE — ED Notes (Signed)
Per EMS [facility] - called out for sudden onset sob.  ems reports pulse ox WNL upon their arrival.  Pt denies sob at this time, denies pain.  Speaking clear full sentences.  nad noted.

## 2013-05-31 NOTE — ED Provider Notes (Signed)
CSN: [id number]     Arrival date & time 05/31/13  1817 History  This chart was scribed for Ward GivensIva L Annita Ratliff, MD by Charline BillsEssence Howell, ED Scribe. The patient was seen in room APA10/APA10. Patient's care was started at 6:43 PM.    Chief Complaint  Patient presents with  . Shortness of Breath    The history is provided by the patient. No language interpreter was used.   HPI Comments: Leah [name] is a 80 y.o. female who presents to the Emergency Department, BIB ambulance, complaining of first episode of sudden shortness of breath that lasted approximately 1 hour. Pt denies shortness of breath at this time. She also reports experiencing lightheadedness, palpations and nausea with the shortness of breath earlier today. Pt denies chest pain, cough and vomiting. EMS reports her pulse ox was normal on their arrival. Patient reports she could feel her heart beating in her chest. She felt like her heart was racing. She denies feeling that way now.  Pt is a resident at [facility].  Pt denies smoking and alcohol use. Pt states that she has not walked in approximately 2 years.   PCP Adriana SimasMatthew Ward  Past Medical History  Diagnosis Date  . Colitis   . Hypertension   . Acid reflux    History reviewed. No pertinent past surgical history. No family history on file. History  Substance Use Topics  . Smoking status: Never Smoker   . Smokeless tobacco: Not on file  . Alcohol Use: No   Lives in ALF Nonambulatory for 2 years  OB History   Grav Para Term Preterm Abortions TAB SAB Ect Mult Living                 Review of Systems  Respiratory: Positive for shortness of breath. Negative for cough.   Cardiovascular: Positive for palpitations. Negative for chest pain.  Gastrointestinal: Positive for nausea. Negative for vomiting.  Neurological: Positive for light-headedness.    Allergies  Review of patient's allergies indicates no known allergies.  Home Medications   Prior to Admission  medications   Not on File   Triage Vitals: BP 175/60  Pulse 70  Temp(Src) 97.8 F (36.6 C) (Oral)  Resp 16  Wt 117 lb (53.071 kg)  SpO2 97%  Vital signs normal except tachycardia   Physical Exam  Nursing note and vitals reviewed. Constitutional: She is oriented to person, place, and time.  Non-toxic appearance. She does not appear ill. No distress.  Elderly, frail  Pleasant   HENT:  Head: Normocephalic and atraumatic.  Right Ear: External ear normal.  Left Ear: External ear normal.  Nose: Nose normal. No mucosal edema or rhinorrhea.  Mouth/Throat: Oropharynx is clear and moist and mucous membranes are normal. No dental abscesses or uvula swelling.  Eyes: Conjunctivae and EOM are normal. Pupils are equal, round, and reactive to light.  Neck: Normal range of motion and full passive range of motion without pain. Neck supple.  Cardiovascular: Normal rate, regular rhythm and normal heart sounds.  Exam reveals no gallop and no friction rub.   No murmur heard. Pulmonary/Chest: Effort normal. No respiratory distress. She has wheezes. She has no rhonchi. She has no rales. She exhibits no tenderness and no crepitus.  Rare wheezing   Abdominal: Soft. Normal appearance and bowel sounds are normal. She exhibits no distension. There is no tenderness. There is no rebound and no guarding.  Musculoskeletal: Normal range of motion. She exhibits no edema and no tenderness.  Moves all extremities well.   Neurological: She is alert and oriented to person, place, and time. She has normal strength. No cranial nerve deficit.  Skin: Skin is warm, dry and intact. No rash noted. No erythema. No pallor.  Bandage on right lower leg anteriorly right above the ankle that pt states is an ulcer  Psychiatric: She has a normal mood and affect. Her speech is normal and behavior is normal. Her mood appears not anxious.    ED Course  Procedures (including critical care time) DIAGNOSTIC STUDIES: Oxygen  Saturation is 97% on RA, normal by my interpretation.    COORDINATION OF CARE: 6:52 PM-Discussed treatment plan which includes CBC and CXR with pt at bedside and pt agreed to plan.   Patient has had no further episodes of shortness of breath while in the ED. She has not had any arrhythmia. She is being discharged back to her facility.  Results for orders placed during the hospital encounter of 05/31/13  CBC WITH DIFFERENTIAL      Result Value Ref Range   WBC 7.6  4.0 - 10.5 K/uL   RBC 4.20  3.87 - 5.11 MIL/uL   Hemoglobin 12.5  12.0 - 15.0 g/dL   HCT 16.1  09.6 - 04.5 %   MCV 89.3  78.0 - 100.0 fL   MCH 29.8  26.0 - 34.0 pg   MCHC 33.3  30.0 - 36.0 g/dL   RDW 40.9  81.1 - 91.4 %   Platelets 375  150 - 400 K/uL   Neutrophils Relative % 60  43 - 77 %   Neutro Abs 4.6  1.7 - 7.7 K/uL   Lymphocytes Relative 24  12 - 46 %   Lymphs Abs 1.8  0.7 - 4.0 K/uL   Monocytes Relative 10  3 - 12 %   Monocytes Absolute 0.8  0.1 - 1.0 K/uL   Eosinophils Relative 5  0 - 5 %   Eosinophils Absolute 0.4  0.0 - 0.7 K/uL   Basophils Relative 1  0 - 1 %   Basophils Absolute 0.1  0.0 - 0.1 K/uL  COMPREHENSIVE METABOLIC PANEL      Result Value Ref Range   Sodium 141  137 - 147 mEq/L   Potassium 3.8  3.7 - 5.3 mEq/L   Chloride 102  96 - 112 mEq/L   CO2 25  19 - 32 mEq/L   Glucose, Bld 115 (*) 70 - 99 mg/dL   BUN 22  6 - 23 mg/dL   Creatinine, Ser 7.82  0.50 - 1.10 mg/dL   Calcium 9.2  8.4 - 95.6 mg/dL   Total Protein 7.5  6.0 - 8.3 g/dL   Albumin 3.3 (*) 3.5 - 5.2 g/dL   AST 17  0 - 37 U/L   ALT 15  0 - 35 U/L   Alkaline Phosphatase 91  39 - 117 U/L   Total Bilirubin 0.4  0.3 - 1.2 mg/dL   GFR calc non Af Amer 75 (*) >90 mL/min   GFR calc Af Amer 87 (*) >90 mL/min  URINALYSIS, ROUTINE W REFLEX MICROSCOPIC      Result Value Ref Range   Color, Urine YELLOW  YELLOW   APPearance CLEAR  CLEAR   Specific Gravity, Urine 1.025  1.005 - 1.030   pH 6.0  5.0 - 8.0   Glucose, UA NEGATIVE  NEGATIVE  mg/dL   Hgb urine dipstick NEGATIVE  NEGATIVE   Bilirubin Urine NEGATIVE  NEGATIVE  Ketones, ur NEGATIVE  NEGATIVE mg/dL   Protein, ur NEGATIVE  NEGATIVE mg/dL   Urobilinogen, UA 0.2  0.0 - 1.0 mg/dL   Nitrite NEGATIVE  NEGATIVE   Leukocytes, UA NEGATIVE  NEGATIVE  MAGNESIUM      Result Value Ref Range   Magnesium 2.2  1.5 - 2.5 mg/dL  TROPONIN I      Result Value Ref Range   Troponin I <0.30  <0.30 ng/mL    Laboratory interpretation all normal    Imaging Review Dg Chest 1 View  05/31/2013   CLINICAL DATA:  Shortness of breath.  EXAM: CHEST - 1 VIEW  COMPARISON:  06/22/2010  FINDINGS: Heart size and pulmonary vascularity are normal. There is tortuosity and calcification of the thoracic aorta. There is chronic slight accentuation of the interstitial markings. No infiltrates or effusions. No acute osseous abnormality.  IMPRESSION: No acute disease.  Chronic interstitial accentuation.   Electronically Signed   By: Geanie CooleyJim  Maxwell M.D.   On: 05/31/2013 19:51     EKG Interpretation None        Date: 05/31/2013  Rate: 64  Rhythm: normal sinus rhythm  QRS Axis: normal  Intervals: normal  ST/T Wave abnormalities: nonspecific ST/T changes  Conduction Disutrbances:none  Narrative Interpretation: Q waves inf and anterior, old  Old EKG Reviewed: none available   MDM   Final diagnoses:  Dyspnea  Palpitation    Plan discharge   Devoria AlbeIva Kerissa Coia, MD, FACEP   I personally performed the services described in this documentation, which was scribed in my presence. The recorded information has been reviewed and considered.  Devoria AlbeIva Kaijah Abts, MD, Armando GangFACEP     Ward GivensIva L Aryaa Bunting, MD 05/31/13 [id number]

## 2013-06-01 NOTE — ED Notes (Signed)
Attempted to call [facility] to [facility], no answer, will call again shortly.

## 2013-06-01 NOTE — ED Notes (Signed)
Gave report to Lanora ManisElizabeth, Merchandiser, retailupervisor at [facility]

## 2013-06-01 NOTE — ED Notes (Signed)
[facility] contacted to transport pt back to [facility]

## 2013-07-09 ENCOUNTER — Encounter: Payer: Self-pay | Admitting: Surgery

## 2013-08-01 ENCOUNTER — Encounter: Payer: Self-pay | Admitting: Surgery

## 2013-09-01 ENCOUNTER — Encounter: Payer: Self-pay | Admitting: Surgery

## 2013-09-07 LAB — WOUND AEROBIC CULTURE

## 2014-02-01 DEATH — deceased

## 2015-01-11 IMAGING — CR DG CHEST 1V
1 series · 1 of 1 positions shown · non-contrast
Comparison: 06/22/2010

CLINICAL DATA: Shortness of breath.

EXAM:
CHEST - 1 VIEW

[ap]
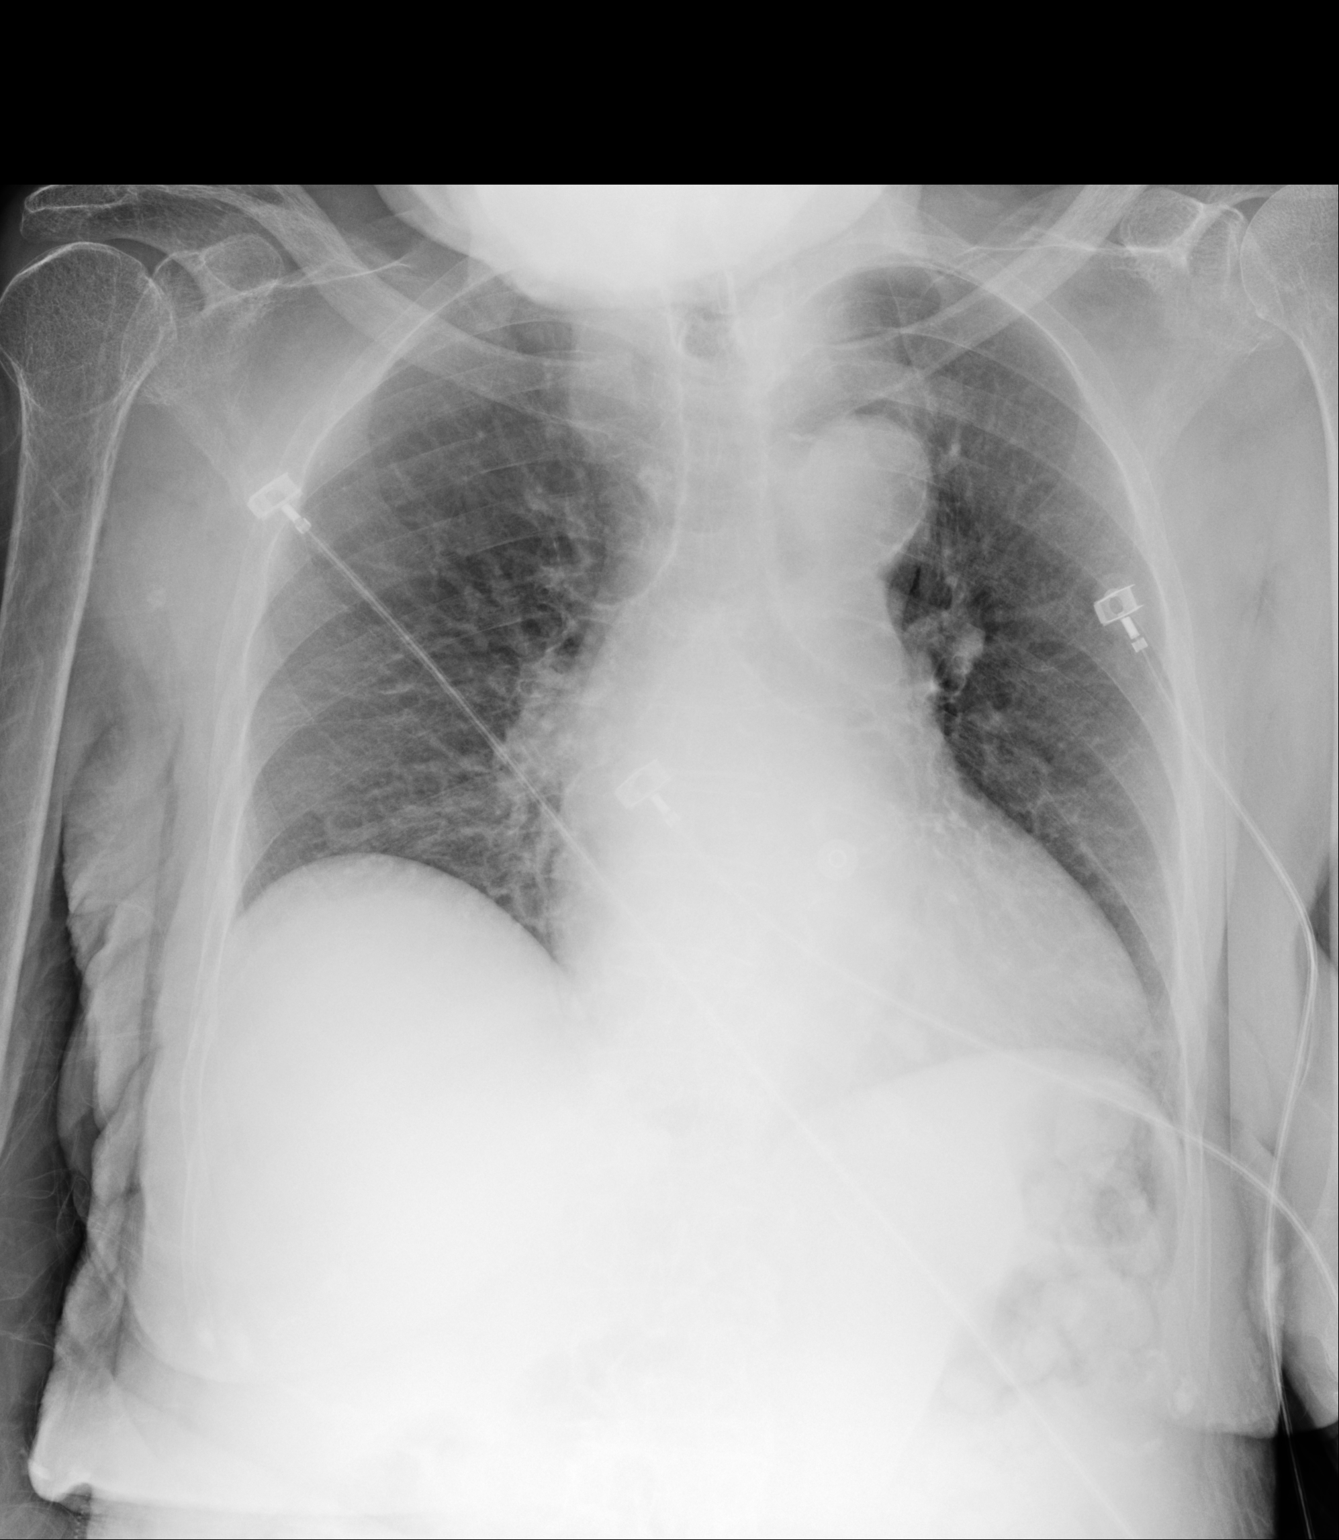

[1 of 1 positions shown; findings below may reference images not displayed]

FINDINGS: Heart size and pulmonary vascularity are normal. There is tortuosity
and calcification of the thoracic aorta. There is chronic slight
accentuation of the interstitial markings. No infiltrates or
effusions. No acute osseous abnormality.
IMPRESSION: No acute disease.  Chronic interstitial accentuation.
